# Patient Record
Sex: Female | Born: 1935 | Race: White | Hispanic: No | Smoking: Never smoker
Health system: Southern US, Community
[De-identification: ages and names within clinical notes are randomized; demographics above are authoritative.]

## PROBLEM LIST (undated history)

## (undated) DIAGNOSIS — Z1509 Genetic susceptibility to other malignant neoplasm: Secondary | ICD-10-CM

## (undated) DIAGNOSIS — E7800 Pure hypercholesterolemia, unspecified: Secondary | ICD-10-CM

## (undated) DIAGNOSIS — R0989 Other specified symptoms and signs involving the circulatory and respiratory systems: Secondary | ICD-10-CM

## (undated) DIAGNOSIS — M1990 Unspecified osteoarthritis, unspecified site: Secondary | ICD-10-CM

## (undated) DIAGNOSIS — Z1501 Genetic susceptibility to malignant neoplasm of breast: Secondary | ICD-10-CM

## (undated) DIAGNOSIS — K5790 Diverticulosis of intestine, part unspecified, without perforation or abscess without bleeding: Secondary | ICD-10-CM

## (undated) DIAGNOSIS — I10 Essential (primary) hypertension: Secondary | ICD-10-CM

## (undated) DIAGNOSIS — T7840XA Allergy, unspecified, initial encounter: Secondary | ICD-10-CM

## (undated) DIAGNOSIS — J383 Other diseases of vocal cords: Secondary | ICD-10-CM

## (undated) DIAGNOSIS — Z1379 Encounter for other screening for genetic and chromosomal anomalies: Principal | ICD-10-CM

## (undated) DIAGNOSIS — E042 Nontoxic multinodular goiter: Secondary | ICD-10-CM

## (undated) DIAGNOSIS — R490 Dysphonia: Secondary | ICD-10-CM

## (undated) DIAGNOSIS — C4490 Unspecified malignant neoplasm of skin, unspecified: Secondary | ICD-10-CM

## (undated) DIAGNOSIS — E559 Vitamin D deficiency, unspecified: Secondary | ICD-10-CM

## (undated) DIAGNOSIS — Z8481 Family history of carrier of genetic disease: Secondary | ICD-10-CM

## (undated) DIAGNOSIS — E538 Deficiency of other specified B group vitamins: Secondary | ICD-10-CM

## (undated) HISTORY — PX: OTHER SURGICAL HISTORY: SHX169

## (undated) HISTORY — DX: Unspecified osteoarthritis, unspecified site: M19.90

## (undated) HISTORY — DX: Family history of carrier of genetic disease: Z84.81

## (undated) HISTORY — PX: HERNIA REPAIR: SHX51

## (undated) HISTORY — PX: GALLBLADDER SURGERY: SHX652

## (undated) HISTORY — PX: BIOPSY THYROID: PRO38

## (undated) HISTORY — DX: Encounter for other screening for genetic and chromosomal anomalies: Z13.79

## (undated) HISTORY — DX: Allergy, unspecified, initial encounter: T78.40XA

## (undated) HISTORY — PX: ABDOMINAL HYSTERECTOMY: SHX81

## (undated) HISTORY — DX: Pure hypercholesterolemia, unspecified: E78.00

## (undated) HISTORY — PX: LIPOMA EXCISION: SHX5283

## (undated) HISTORY — DX: Genetic susceptibility to malignant neoplasm of breast: Z15.01

## (undated) HISTORY — PX: CHOLECYSTECTOMY: SHX55

## (undated) HISTORY — DX: Genetic susceptibility to other malignant neoplasm: Z15.09

## (undated) HISTORY — DX: Other specified symptoms and signs involving the circulatory and respiratory systems: R09.89

## (undated) HISTORY — DX: Dysphonia: R49.0

## (undated) HISTORY — PX: OOPHORECTOMY: SHX86

## (undated) HISTORY — PX: COLONOSCOPY: SHX174

## (undated) HISTORY — DX: Deficiency of other specified B group vitamins: E53.8

## (undated) HISTORY — DX: Other diseases of vocal cords: J38.3

## (undated) HISTORY — DX: Diverticulosis of intestine, part unspecified, without perforation or abscess without bleeding: K57.90

## (undated) HISTORY — DX: Vitamin D deficiency, unspecified: E55.9

## (undated) HISTORY — DX: Essential (primary) hypertension: I10

---

## 2000-01-07 ENCOUNTER — Other Ambulatory Visit: Admission: RE | Admit: 2000-01-07 | Discharge: 2000-01-07 | Payer: Self-pay | Admitting: *Deleted

## 2004-03-01 ENCOUNTER — Ambulatory Visit (HOSPITAL_BASED_OUTPATIENT_CLINIC_OR_DEPARTMENT_OTHER): Admission: RE | Admit: 2004-03-01 | Discharge: 2004-03-01 | Payer: Self-pay | Admitting: Surgery

## 2004-03-01 ENCOUNTER — Ambulatory Visit (HOSPITAL_COMMUNITY): Admission: RE | Admit: 2004-03-01 | Discharge: 2004-03-01 | Payer: Self-pay | Admitting: Surgery

## 2011-03-31 DIAGNOSIS — Z1331 Encounter for screening for depression: Secondary | ICD-10-CM | POA: Diagnosis not present

## 2011-03-31 DIAGNOSIS — R1900 Intra-abdominal and pelvic swelling, mass and lump, unspecified site: Secondary | ICD-10-CM | POA: Diagnosis not present

## 2011-04-03 ENCOUNTER — Other Ambulatory Visit: Payer: Self-pay | Admitting: Family Medicine

## 2011-04-03 ENCOUNTER — Ambulatory Visit
Admission: RE | Admit: 2011-04-03 | Discharge: 2011-04-03 | Disposition: A | Discharge status: Home / Self Care | Payer: Medicare Other | Source: Ambulatory Visit | Attending: Family Medicine | Admitting: Family Medicine

## 2011-04-03 DIAGNOSIS — R1031 Right lower quadrant pain: Secondary | ICD-10-CM

## 2011-04-03 DIAGNOSIS — R109 Unspecified abdominal pain: Secondary | ICD-10-CM

## 2011-04-03 DIAGNOSIS — K4090 Unilateral inguinal hernia, without obstruction or gangrene, not specified as recurrent: Secondary | ICD-10-CM | POA: Diagnosis not present

## 2011-04-03 DIAGNOSIS — R1900 Intra-abdominal and pelvic swelling, mass and lump, unspecified site: Secondary | ICD-10-CM

## 2011-04-03 DIAGNOSIS — R1909 Other intra-abdominal and pelvic swelling, mass and lump: Secondary | ICD-10-CM | POA: Diagnosis not present

## 2011-04-03 MED ORDER — IOHEXOL 300 MG/ML  SOLN
100.0000 mL | Freq: Once | INTRAMUSCULAR | Status: AC | PRN
  Administered 2011-04-03: 100 mL via INTRAVENOUS

## 2011-04-03 MED ORDER — IOHEXOL 300 MG/ML  SOLN
20.0000 mL | Freq: Once | INTRAMUSCULAR | Status: AC | PRN
  Administered 2011-04-03: 20 mL via ORAL

## 2011-04-07 ENCOUNTER — Other Ambulatory Visit (HOSPITAL_COMMUNITY): Payer: Self-pay | Admitting: Family Medicine

## 2011-04-07 DIAGNOSIS — R898 Other abnormal findings in specimens from other organs, systems and tissues: Secondary | ICD-10-CM

## 2011-04-07 DIAGNOSIS — R1031 Right lower quadrant pain: Secondary | ICD-10-CM

## 2011-04-08 DIAGNOSIS — D759 Disease of blood and blood-forming organs, unspecified: Secondary | ICD-10-CM | POA: Diagnosis not present

## 2011-04-10 ENCOUNTER — Encounter (HOSPITAL_COMMUNITY)
Admission: RE | Admit: 2011-04-10 | Discharge: 2011-04-10 | Disposition: A | Discharge status: Home / Self Care | Payer: Medicare Other | Source: Ambulatory Visit | Attending: Family Medicine | Admitting: Family Medicine

## 2011-04-10 ENCOUNTER — Other Ambulatory Visit (HOSPITAL_COMMUNITY): Payer: Self-pay | Admitting: Family Medicine

## 2011-04-10 DIAGNOSIS — R1031 Right lower quadrant pain: Secondary | ICD-10-CM

## 2011-04-10 DIAGNOSIS — M19019 Primary osteoarthritis, unspecified shoulder: Secondary | ICD-10-CM | POA: Diagnosis not present

## 2011-04-10 DIAGNOSIS — E119 Type 2 diabetes mellitus without complications: Secondary | ICD-10-CM | POA: Diagnosis not present

## 2011-04-10 DIAGNOSIS — R898 Other abnormal findings in specimens from other organs, systems and tissues: Secondary | ICD-10-CM

## 2011-04-10 DIAGNOSIS — D759 Disease of blood and blood-forming organs, unspecified: Secondary | ICD-10-CM | POA: Diagnosis not present

## 2011-04-10 MED ORDER — TECHNETIUM TC 99M MEDRONATE IV KIT
25.0000 | PACK | Freq: Once | INTRAVENOUS | Status: AC | PRN
  Administered 2011-04-10: 25 via INTRAVENOUS

## 2011-04-17 ENCOUNTER — Other Ambulatory Visit (HOSPITAL_COMMUNITY): Payer: Medicare Other

## 2011-04-17 ENCOUNTER — Encounter (HOSPITAL_COMMUNITY): Payer: Medicare Other

## 2011-04-30 ENCOUNTER — Encounter (INDEPENDENT_AMBULATORY_CARE_PROVIDER_SITE_OTHER): Payer: Self-pay | Admitting: General Surgery

## 2011-05-01 ENCOUNTER — Ambulatory Visit (INDEPENDENT_AMBULATORY_CARE_PROVIDER_SITE_OTHER): Payer: Medicare Other | Admitting: Surgery

## 2011-05-01 ENCOUNTER — Encounter (INDEPENDENT_AMBULATORY_CARE_PROVIDER_SITE_OTHER): Payer: Self-pay | Admitting: Surgery

## 2011-05-01 VITALS — BP 118/64 | HR 80 | Temp 97.8°F | Resp 16 | Ht 60.5 in | Wt 129.6 lb

## 2011-05-01 DIAGNOSIS — K4090 Unilateral inguinal hernia, without obstruction or gangrene, not specified as recurrent: Secondary | ICD-10-CM | POA: Insufficient documentation

## 2011-05-01 NOTE — Progress Notes (Signed)
Sherry Oconnor comes in today with some soreness in her right lower quadrant and a CT scan which are reviewed which shows a small fat-containing inguinal hernia. At first I wasn't convinced that what I'm feeling was a hernia but I think it is an indirect with fat. It's riding fairly high lateral. It is minimally symptomatic. A thoracotomy would be worth watching. As her scheduled appointment see back in 3 months. If she is totally asymptomatic at that time she can then put off followup with me until such time as it become symptomatic. Since it does contain just lateral think she is in imminent danger.  Impression small fat-containing right inguinal hernia. Plan observation. Return in 3 months

## 2011-06-27 DIAGNOSIS — I10 Essential (primary) hypertension: Secondary | ICD-10-CM | POA: Diagnosis not present

## 2011-06-27 DIAGNOSIS — E785 Hyperlipidemia, unspecified: Secondary | ICD-10-CM | POA: Diagnosis not present

## 2011-06-27 DIAGNOSIS — Z23 Encounter for immunization: Secondary | ICD-10-CM | POA: Diagnosis not present

## 2011-06-27 DIAGNOSIS — E119 Type 2 diabetes mellitus without complications: Secondary | ICD-10-CM | POA: Diagnosis not present

## 2011-07-15 DIAGNOSIS — L821 Other seborrheic keratosis: Secondary | ICD-10-CM | POA: Diagnosis not present

## 2011-07-15 DIAGNOSIS — D235 Other benign neoplasm of skin of trunk: Secondary | ICD-10-CM | POA: Diagnosis not present

## 2011-07-15 DIAGNOSIS — L570 Actinic keratosis: Secondary | ICD-10-CM | POA: Diagnosis not present

## 2011-07-31 ENCOUNTER — Encounter (INDEPENDENT_AMBULATORY_CARE_PROVIDER_SITE_OTHER): Payer: Medicare Other | Admitting: Surgery

## 2011-12-11 DIAGNOSIS — Z1231 Encounter for screening mammogram for malignant neoplasm of breast: Secondary | ICD-10-CM | POA: Diagnosis not present

## 2011-12-26 DIAGNOSIS — J309 Allergic rhinitis, unspecified: Secondary | ICD-10-CM | POA: Diagnosis not present

## 2011-12-26 DIAGNOSIS — I998 Other disorder of circulatory system: Secondary | ICD-10-CM | POA: Diagnosis not present

## 2011-12-26 DIAGNOSIS — I10 Essential (primary) hypertension: Secondary | ICD-10-CM | POA: Diagnosis not present

## 2011-12-26 DIAGNOSIS — E119 Type 2 diabetes mellitus without complications: Secondary | ICD-10-CM | POA: Diagnosis not present

## 2011-12-26 DIAGNOSIS — E785 Hyperlipidemia, unspecified: Secondary | ICD-10-CM | POA: Diagnosis not present

## 2011-12-26 DIAGNOSIS — M25569 Pain in unspecified knee: Secondary | ICD-10-CM | POA: Diagnosis not present

## 2011-12-26 DIAGNOSIS — Z23 Encounter for immunization: Secondary | ICD-10-CM | POA: Diagnosis not present

## 2011-12-30 DIAGNOSIS — E1139 Type 2 diabetes mellitus with other diabetic ophthalmic complication: Secondary | ICD-10-CM | POA: Diagnosis not present

## 2012-07-01 DIAGNOSIS — I10 Essential (primary) hypertension: Secondary | ICD-10-CM | POA: Diagnosis not present

## 2012-07-01 DIAGNOSIS — E119 Type 2 diabetes mellitus without complications: Secondary | ICD-10-CM | POA: Diagnosis not present

## 2012-07-01 DIAGNOSIS — E785 Hyperlipidemia, unspecified: Secondary | ICD-10-CM | POA: Diagnosis not present

## 2012-07-01 DIAGNOSIS — Z1331 Encounter for screening for depression: Secondary | ICD-10-CM | POA: Diagnosis not present

## 2012-07-01 DIAGNOSIS — R439 Unspecified disturbances of smell and taste: Secondary | ICD-10-CM | POA: Diagnosis not present

## 2012-09-14 DIAGNOSIS — J387 Other diseases of larynx: Secondary | ICD-10-CM | POA: Diagnosis not present

## 2012-09-14 DIAGNOSIS — R6889 Other general symptoms and signs: Secondary | ICD-10-CM | POA: Diagnosis not present

## 2012-11-23 DIAGNOSIS — L820 Inflamed seborrheic keratosis: Secondary | ICD-10-CM | POA: Diagnosis not present

## 2012-11-23 DIAGNOSIS — L538 Other specified erythematous conditions: Secondary | ICD-10-CM | POA: Diagnosis not present

## 2012-11-23 DIAGNOSIS — L219 Seborrheic dermatitis, unspecified: Secondary | ICD-10-CM | POA: Diagnosis not present

## 2012-11-23 DIAGNOSIS — L570 Actinic keratosis: Secondary | ICD-10-CM | POA: Diagnosis not present

## 2012-12-17 DIAGNOSIS — L820 Inflamed seborrheic keratosis: Secondary | ICD-10-CM | POA: Diagnosis not present

## 2012-12-17 DIAGNOSIS — L570 Actinic keratosis: Secondary | ICD-10-CM | POA: Diagnosis not present

## 2012-12-31 DIAGNOSIS — Z1231 Encounter for screening mammogram for malignant neoplasm of breast: Secondary | ICD-10-CM | POA: Diagnosis not present

## 2013-01-03 DIAGNOSIS — E785 Hyperlipidemia, unspecified: Secondary | ICD-10-CM | POA: Diagnosis not present

## 2013-01-03 DIAGNOSIS — I10 Essential (primary) hypertension: Secondary | ICD-10-CM | POA: Diagnosis not present

## 2013-01-03 DIAGNOSIS — K219 Gastro-esophageal reflux disease without esophagitis: Secondary | ICD-10-CM | POA: Diagnosis not present

## 2013-01-03 DIAGNOSIS — E538 Deficiency of other specified B group vitamins: Secondary | ICD-10-CM | POA: Diagnosis not present

## 2013-01-03 DIAGNOSIS — E119 Type 2 diabetes mellitus without complications: Secondary | ICD-10-CM | POA: Diagnosis not present

## 2013-01-03 DIAGNOSIS — Z79899 Other long term (current) drug therapy: Secondary | ICD-10-CM | POA: Diagnosis not present

## 2013-01-03 DIAGNOSIS — E559 Vitamin D deficiency, unspecified: Secondary | ICD-10-CM | POA: Diagnosis not present

## 2013-01-03 DIAGNOSIS — Z23 Encounter for immunization: Secondary | ICD-10-CM | POA: Diagnosis not present

## 2013-01-04 DIAGNOSIS — E119 Type 2 diabetes mellitus without complications: Secondary | ICD-10-CM | POA: Diagnosis not present

## 2013-01-18 DIAGNOSIS — L570 Actinic keratosis: Secondary | ICD-10-CM | POA: Diagnosis not present

## 2013-01-18 DIAGNOSIS — L820 Inflamed seborrheic keratosis: Secondary | ICD-10-CM | POA: Diagnosis not present

## 2013-04-06 DIAGNOSIS — E538 Deficiency of other specified B group vitamins: Secondary | ICD-10-CM | POA: Diagnosis not present

## 2013-04-06 DIAGNOSIS — E559 Vitamin D deficiency, unspecified: Secondary | ICD-10-CM | POA: Diagnosis not present

## 2013-07-11 DIAGNOSIS — E538 Deficiency of other specified B group vitamins: Secondary | ICD-10-CM | POA: Diagnosis not present

## 2013-07-11 DIAGNOSIS — E119 Type 2 diabetes mellitus without complications: Secondary | ICD-10-CM | POA: Diagnosis not present

## 2013-07-11 DIAGNOSIS — Z0000 Encounter for general adult medical examination without abnormal findings: Secondary | ICD-10-CM | POA: Diagnosis not present

## 2013-07-11 DIAGNOSIS — E559 Vitamin D deficiency, unspecified: Secondary | ICD-10-CM | POA: Diagnosis not present

## 2013-07-11 DIAGNOSIS — Z23 Encounter for immunization: Secondary | ICD-10-CM | POA: Diagnosis not present

## 2013-07-11 DIAGNOSIS — I10 Essential (primary) hypertension: Secondary | ICD-10-CM | POA: Diagnosis not present

## 2013-07-11 DIAGNOSIS — E785 Hyperlipidemia, unspecified: Secondary | ICD-10-CM | POA: Diagnosis not present

## 2013-07-11 DIAGNOSIS — Z1211 Encounter for screening for malignant neoplasm of colon: Secondary | ICD-10-CM | POA: Diagnosis not present

## 2013-07-14 ENCOUNTER — Other Ambulatory Visit: Payer: Self-pay | Admitting: Family Medicine

## 2013-07-14 DIAGNOSIS — E041 Nontoxic single thyroid nodule: Secondary | ICD-10-CM

## 2013-07-18 ENCOUNTER — Other Ambulatory Visit: Payer: Self-pay | Admitting: Family Medicine

## 2013-07-18 ENCOUNTER — Ambulatory Visit
Admission: RE | Admit: 2013-07-18 | Discharge: 2013-07-18 | Disposition: A | Discharge status: Home / Self Care | Payer: Medicare Other | Source: Ambulatory Visit | Attending: Family Medicine | Admitting: Family Medicine

## 2013-07-18 DIAGNOSIS — E041 Nontoxic single thyroid nodule: Secondary | ICD-10-CM

## 2013-07-18 DIAGNOSIS — E042 Nontoxic multinodular goiter: Secondary | ICD-10-CM

## 2013-07-27 DIAGNOSIS — R195 Other fecal abnormalities: Secondary | ICD-10-CM | POA: Diagnosis not present

## 2013-07-28 ENCOUNTER — Ambulatory Visit
Admission: RE | Admit: 2013-07-28 | Discharge: 2013-07-28 | Disposition: A | Discharge status: Home / Self Care | Payer: Medicare Other | Source: Ambulatory Visit | Attending: Family Medicine | Admitting: Family Medicine

## 2013-07-28 ENCOUNTER — Other Ambulatory Visit (HOSPITAL_COMMUNITY)
Admission: RE | Admit: 2013-07-28 | Discharge: 2013-07-28 | Disposition: A | Discharge status: Home / Self Care | Payer: Medicare Other | Source: Ambulatory Visit | Attending: Interventional Radiology | Admitting: Interventional Radiology

## 2013-07-28 DIAGNOSIS — E042 Nontoxic multinodular goiter: Secondary | ICD-10-CM | POA: Insufficient documentation

## 2013-07-28 DIAGNOSIS — E041 Nontoxic single thyroid nodule: Secondary | ICD-10-CM | POA: Diagnosis not present

## 2013-07-28 DIAGNOSIS — E0789 Other specified disorders of thyroid: Secondary | ICD-10-CM | POA: Diagnosis not present

## 2013-07-29 ENCOUNTER — Emergency Department (HOSPITAL_COMMUNITY): Payer: Medicare Other

## 2013-07-29 ENCOUNTER — Encounter (HOSPITAL_COMMUNITY): Payer: Self-pay | Admitting: Emergency Medicine

## 2013-07-29 ENCOUNTER — Emergency Department (HOSPITAL_COMMUNITY)
Admission: EM | Admit: 2013-07-29 | Discharge: 2013-07-29 | Disposition: A | Discharge status: Home / Self Care | Payer: Medicare Other | Attending: Emergency Medicine | Admitting: Emergency Medicine

## 2013-07-29 DIAGNOSIS — M129 Arthropathy, unspecified: Secondary | ICD-10-CM | POA: Insufficient documentation

## 2013-07-29 DIAGNOSIS — IMO0002 Reserved for concepts with insufficient information to code with codable children: Secondary | ICD-10-CM | POA: Insufficient documentation

## 2013-07-29 DIAGNOSIS — Z791 Long term (current) use of non-steroidal anti-inflammatories (NSAID): Secondary | ICD-10-CM | POA: Diagnosis not present

## 2013-07-29 DIAGNOSIS — E119 Type 2 diabetes mellitus without complications: Secondary | ICD-10-CM | POA: Diagnosis not present

## 2013-07-29 DIAGNOSIS — E7800 Pure hypercholesterolemia, unspecified: Secondary | ICD-10-CM | POA: Diagnosis not present

## 2013-07-29 DIAGNOSIS — I10 Essential (primary) hypertension: Secondary | ICD-10-CM | POA: Diagnosis not present

## 2013-07-29 DIAGNOSIS — Z7982 Long term (current) use of aspirin: Secondary | ICD-10-CM | POA: Diagnosis not present

## 2013-07-29 DIAGNOSIS — M542 Cervicalgia: Secondary | ICD-10-CM | POA: Insufficient documentation

## 2013-07-29 DIAGNOSIS — E042 Nontoxic multinodular goiter: Secondary | ICD-10-CM | POA: Diagnosis not present

## 2013-07-29 LAB — BASIC METABOLIC PANEL
BUN: 24 mg/dL — ABNORMAL HIGH (ref 6–23)
CALCIUM: 10 mg/dL (ref 8.4–10.5)
CO2: 25 mEq/L (ref 19–32)
Chloride: 106 mEq/L (ref 96–112)
Creatinine, Ser: 0.66 mg/dL (ref 0.50–1.10)
GFR, EST NON AFRICAN AMERICAN: 82 mL/min — AB (ref >90)
Glucose, Bld: 139 mg/dL — ABNORMAL HIGH (ref 70–99)
Potassium: 4.1 mEq/L (ref 3.7–5.3)
SODIUM: 144 meq/L (ref 137–147)

## 2013-07-29 LAB — CBC WITH DIFFERENTIAL/PLATELET
BASOS ABS: 0 10*3/uL (ref 0.0–0.1)
BASOS PCT: 0 % (ref 0–1)
EOS ABS: 0.1 10*3/uL (ref 0.0–0.7)
EOS PCT: 1 % (ref 0–5)
HCT: 40.2 % (ref 36.0–46.0)
Hemoglobin: 13.8 g/dL (ref 12.0–15.0)
LYMPHS PCT: 30 % (ref 12–46)
Lymphs Abs: 2.2 10*3/uL (ref 0.7–4.0)
MCH: 30.9 pg (ref 26.0–34.0)
MCHC: 34.3 g/dL (ref 30.0–36.0)
MCV: 89.9 fL (ref 78.0–100.0)
Monocytes Absolute: 0.5 10*3/uL (ref 0.1–1.0)
Monocytes Relative: 7 % (ref 3–12)
Neutro Abs: 4.5 10*3/uL (ref 1.7–7.7)
Neutrophils Relative %: 62 % (ref 43–77)
PLATELETS: 260 10*3/uL (ref 150–400)
RBC: 4.47 MIL/uL (ref 3.87–5.11)
RDW: 12.6 % (ref 11.5–15.5)
WBC: 7.2 10*3/uL (ref 4.0–10.5)

## 2013-07-29 MED ORDER — IOHEXOL 300 MG/ML  SOLN
75.0000 mL | Freq: Once | INTRAMUSCULAR | Status: AC | PRN
  Administered 2013-07-29: 75 mL via INTRAVENOUS

## 2013-07-29 NOTE — ED Provider Notes (Signed)
CSN: [id number]     Arrival date & time 07/29/13  9735 History   First MD Initiated Contact with Patient 07/29/13 1038     Chief Complaint  Patient presents with  . Neck Pain     (Consider location/radiation/quality/duration/timing/severity/associated sxs/prior Treatment) The history is provided by the patient and medical records.   This is a 80 year old female with past medical history significant for hypertension, diabetes, hypercholesterolemia, arthritis, presenting to the ED for right-sided neck pain for the past 2 days.  Patient states she has a thyroid biopsy yesterday at [facility] for further evaluation of a thyroid nodule.  Patient states procedure was without complication, she has minimal bruising.  States after procedure, she is felt that swelling and pain had intensified-- thought possibly due to the way her head and neck were positioned on table. She reports some pain with swallowing, but no difficulty doing so. She was having some nausea earlier today but denies vomiting. No other prior neck procedures.  No chest pain or SOB.  Denies any headache, cough, sore throat, nasal congestion, fever, chills, or other URI sx.  Pt has been taking mobic without significant relief of her pain.  Past Medical History  Diagnosis Date  . Hypertension   . Diabetes mellitus   . Hypercholesteremia   . Diverticulosis   . Arthritis    Past Surgical History  Procedure Laterality Date  . Abdominal hysterectomy    . Gallbladder surgery    . Lipoma excision    . Oophorectomy     Family History  Problem Relation Age of Onset  . Heart disease Mother   . Cancer Sister     breast   History  Substance Use Topics  . Smoking status: Never Smoker   . Smokeless tobacco: Not on file  . Alcohol Use: No   OB History   Grav Para Term Preterm Abortions TAB SAB Ect Mult Living                 Review of Systems  Musculoskeletal: Positive for neck pain.  All other systems reviewed and are  negative.     Allergies  Darvon; Septra; Sulfa drugs cross reactors; Zithromax; and Codeine  Home Medications   Prior to Admission medications   Medication Sig Start Date End Date Taking? Authorizing Provider  amLODipine (NORVASC) 5 MG tablet Take 5 mg by mouth daily.   Yes Historical Provider, MD  aspirin 325 MG EC tablet Take 325 mg by mouth daily.   Yes Historical Provider, MD  Cholecalciferol (VITAMIN D PO) Take 1 tablet by mouth daily.   Yes Historical Provider, MD  Cyanocobalamin (VITAMIN B 12 PO) Take 1 tablet by mouth daily.   Yes Historical Provider, MD  fluticasone (FLONASE) 50 MCG/ACT nasal spray Place 2 sprays into the nose 2 (two) times daily at 10 AM and 5 PM.   Yes Historical Provider, MD  lisinopril (PRINIVIL,ZESTRIL) 20 MG tablet Take 20 mg by mouth daily.   Yes Historical Provider, MD  meloxicam (MOBIC) 15 MG tablet Take 15 mg by mouth daily as needed for pain.   Yes Historical Provider, MD  metFORMIN (GLUCOPHAGE-XR) 750 MG 24 hr tablet Take 1,500 mg by mouth every evening.    Yes Historical Provider, MD  naproxen sodium (ANAPROX) 220 MG tablet Take 220 mg by mouth as needed (pain).    Yes Historical Provider, MD  pravastatin (PRAVACHOL) 80 MG tablet Take 80 mg by mouth daily.   Yes Historical Provider, MD  BP 166/63  Pulse 75  Temp(Src) 98 F (36.7 C) (Oral)  Resp 17  SpO2 100%  Physical Exam  Nursing note and vitals reviewed. Constitutional: She is oriented to person, place, and time. She appears well-developed and well-nourished. No distress.  HENT:  Head: Normocephalic and atraumatic.  Mouth/Throat: Oropharynx is clear and moist.  Eyes: Conjunctivae and EOM are normal. Pupils are equal, round, and reactive to light.  Neck: Normal range of motion. No spinous process tenderness and no muscular tenderness present. No rigidity. No tracheal deviation present. Thyromegaly present.  Mild right sided neck tenderness and swelling along SCM muscle; full ROM  maintained; no nuchal rigidity; trachea midline, thyroid does feel enlarged; strong carotid pulses bilaterally; no bruits appreciated; handling secretions appropriately; no difficulty swallowing Small right sided neck needle incision clean without signs of infection; small amount of bruising to top of sternum which is non-tender  Cardiovascular: Normal rate, regular rhythm and normal heart sounds.   Pulmonary/Chest: Effort normal and breath sounds normal. No stridor. No respiratory distress. She has no wheezes.  Abdominal: Soft. Bowel sounds are normal.  Musculoskeletal: Normal range of motion.  Neurological: She is alert and oriented to person, place, and time.  Skin: Skin is warm and dry. She is not diaphoretic.  Psychiatric: She has a normal mood and affect.    ED Course  Procedures (including critical care time) Labs Review Labs Reviewed  BASIC METABOLIC PANEL - Abnormal; Notable for the following:    Glucose, Bld 139 (*)    BUN 24 (*)    GFR calc non Af Amer 82 (*)    All other components within normal limits  CBC WITH DIFFERENTIAL    Imaging Review US Thyroid Biopsy  07/28/2013   CLINICAL DATA:  Dominant 3 cm right thyroid mass, multinodular thyroid  EXAM: ULTRASOUND GUIDED NEEDLE ASPIRATE BIOPSY OF THE THYROID GLAND  COMPARISON:  07/18/2013  PROCEDURE: Thyroid biopsy was thoroughly discussed with the patient and questions were answered. The benefits, risks, alternatives, and complications were also discussed. The patient understands and wishes to proceed with the procedure. Written consent was obtained.  Ultrasound was performed to localize and mark an adequate site for the biopsy. The patient was then prepped and draped in a normal sterile fashion. Local anesthesia was provided with 1% lidocaine. Using direct ultrasound guidance, 4 passes were made using needles into the nodule within the right lobe of the thyroid. Ultrasound was used to confirm needle placements on all occasions.  Specimens were sent to Pathology for analysis.  Complications:  No immediate  FINDINGS: Imaging confirms needle placement in the dominant right 3 cm thyroid mass  IMPRESSION: Ultrasound guided needle aspirate biopsy performed of the dominant right 3 cm thyroid mass.  Recommendation: The smaller ill-defined left thyroid nodules should be followed with a repeat thyroid ultrasound in 1 year. This was discussed with the patient at the time of biopsy.   Electronically Signed   By: Daryll Brod M.D.   On: 07/28/2013 09:32     EKG Interpretation None      MDM   Final diagnoses:  Neck pain on right side   80 y.o. F with right sided neck pain for the past 3 days, worsening after thyroid bx yesterday.  Pt currently afebrile and non-toxic appearing.  Pain tracks along her SCM, mild tenderness and swelling associated.  No nuchal rigidity or pain with ROM.  No airway compromise, handling secretions appropriately.  Concern for possible post-op complication, bleeding, or hematoma.  Will obtain basic labs and CT soft tissue of neck.  Labs reassuring.  CT negative for acute findings.  On re-evaluation, pt remains without airway compromise or difficulty handling secretions.  She continues speaking in full complete sentences without difficulty.  No signs of meningitis or other toxicity.  Pts pain is reproducible with palpation to right side her neck, likely MSK.  Pt will be discharged home with close PCP FU.  Discussed plan with patient, he/she acknowledged understanding and agreed with plan of care.  Return precautions given for new or worsening symptoms.  Case discussed with Dr. Jeanell Sparrow who personally evaluated pt and agrees with assessment and plan of care.  Larene Pickett, PA-C 07/29/13 1531

## 2013-07-29 NOTE — ED Notes (Signed)
Pt reports pain to right side of neck x 2 days. Worse with palpation. States at times hurts when she swallows. Denies recent illness. Reports nausea this am. States yesterday had biopsy of thyroid.

## 2013-07-29 NOTE — Discharge Instructions (Signed)
You work-up today including labs and neck CT were normal. You may follow-up with your primary care physician. Return to the ED for new or worsening symptoms-- high fever, difficulty swallowing, trouble breathing, etc.

## 2013-07-29 NOTE — ED Notes (Signed)
Pt coming from home with c/o of discomfort in the right neck.  Pt states she has noticed swelling and soreness for approximately 3 days.  Pt right neck is noticably larger than the left, denies injury.

## 2013-08-01 NOTE — ED Provider Notes (Signed)
80 y.o. Female complaining of neck pain after thyroid biopsy several days ago.  No difficulty speaking or breathing.  Patient had ct done which reveals no evidence of anatomical abnormality from procedure.  No pe signs worrisome for infection.   I performed a history and physical examination of Sherry Oconnor and discussed her management with Ms. Sherry Oconnor  I agree with the history, physical, assessment, and plan of care, with the following exceptions: None  I was present for the following procedures: None Time Spent in Critical Care of the patient: None Time spent in discussions with the patient and family: [place], MD 08/01/13 [phone]

## 2013-08-02 DIAGNOSIS — M542 Cervicalgia: Secondary | ICD-10-CM | POA: Diagnosis not present

## 2013-08-16 DIAGNOSIS — R195 Other fecal abnormalities: Secondary | ICD-10-CM | POA: Diagnosis not present

## 2013-08-25 ENCOUNTER — Other Ambulatory Visit: Payer: Self-pay | Admitting: Gastroenterology

## 2013-08-25 DIAGNOSIS — R195 Other fecal abnormalities: Secondary | ICD-10-CM | POA: Diagnosis not present

## 2013-08-25 DIAGNOSIS — D126 Benign neoplasm of colon, unspecified: Secondary | ICD-10-CM | POA: Diagnosis not present

## 2013-08-25 DIAGNOSIS — K5730 Diverticulosis of large intestine without perforation or abscess without bleeding: Secondary | ICD-10-CM | POA: Diagnosis not present

## 2013-11-18 DIAGNOSIS — Z23 Encounter for immunization: Secondary | ICD-10-CM | POA: Diagnosis not present

## 2014-01-12 DIAGNOSIS — E785 Hyperlipidemia, unspecified: Secondary | ICD-10-CM | POA: Diagnosis not present

## 2014-01-12 DIAGNOSIS — E049 Nontoxic goiter, unspecified: Secondary | ICD-10-CM | POA: Diagnosis not present

## 2014-01-12 DIAGNOSIS — I10 Essential (primary) hypertension: Secondary | ICD-10-CM | POA: Diagnosis not present

## 2014-01-12 DIAGNOSIS — E119 Type 2 diabetes mellitus without complications: Secondary | ICD-10-CM | POA: Diagnosis not present

## 2014-01-12 DIAGNOSIS — E559 Vitamin D deficiency, unspecified: Secondary | ICD-10-CM | POA: Diagnosis not present

## 2014-01-12 DIAGNOSIS — E538 Deficiency of other specified B group vitamins: Secondary | ICD-10-CM | POA: Diagnosis not present

## 2014-01-19 DIAGNOSIS — E119 Type 2 diabetes mellitus without complications: Secondary | ICD-10-CM | POA: Diagnosis not present

## 2014-01-25 DIAGNOSIS — H1013 Acute atopic conjunctivitis, bilateral: Secondary | ICD-10-CM | POA: Diagnosis not present

## 2014-07-13 DIAGNOSIS — E119 Type 2 diabetes mellitus without complications: Secondary | ICD-10-CM | POA: Diagnosis not present

## 2014-07-13 DIAGNOSIS — J069 Acute upper respiratory infection, unspecified: Secondary | ICD-10-CM | POA: Diagnosis not present

## 2014-07-13 DIAGNOSIS — E785 Hyperlipidemia, unspecified: Secondary | ICD-10-CM | POA: Diagnosis not present

## 2014-07-13 DIAGNOSIS — M7061 Trochanteric bursitis, right hip: Secondary | ICD-10-CM | POA: Diagnosis not present

## 2014-07-13 DIAGNOSIS — J309 Allergic rhinitis, unspecified: Secondary | ICD-10-CM | POA: Diagnosis not present

## 2014-07-13 DIAGNOSIS — E559 Vitamin D deficiency, unspecified: Secondary | ICD-10-CM | POA: Diagnosis not present

## 2014-07-13 DIAGNOSIS — I10 Essential (primary) hypertension: Secondary | ICD-10-CM | POA: Diagnosis not present

## 2014-07-13 DIAGNOSIS — E049 Nontoxic goiter, unspecified: Secondary | ICD-10-CM | POA: Diagnosis not present

## 2014-10-25 DIAGNOSIS — Z23 Encounter for immunization: Secondary | ICD-10-CM | POA: Diagnosis not present

## 2014-12-20 DIAGNOSIS — Z803 Family history of malignant neoplasm of breast: Secondary | ICD-10-CM | POA: Diagnosis not present

## 2014-12-20 DIAGNOSIS — Z1231 Encounter for screening mammogram for malignant neoplasm of breast: Secondary | ICD-10-CM | POA: Diagnosis not present

## 2015-01-19 ENCOUNTER — Other Ambulatory Visit: Payer: Self-pay | Admitting: Family Medicine

## 2015-01-19 DIAGNOSIS — Z0000 Encounter for general adult medical examination without abnormal findings: Secondary | ICD-10-CM | POA: Diagnosis not present

## 2015-01-19 DIAGNOSIS — M545 Low back pain: Secondary | ICD-10-CM | POA: Diagnosis not present

## 2015-01-19 DIAGNOSIS — E041 Nontoxic single thyroid nodule: Secondary | ICD-10-CM

## 2015-01-19 DIAGNOSIS — E559 Vitamin D deficiency, unspecified: Secondary | ICD-10-CM | POA: Diagnosis not present

## 2015-01-19 DIAGNOSIS — E785 Hyperlipidemia, unspecified: Secondary | ICD-10-CM | POA: Diagnosis not present

## 2015-01-19 DIAGNOSIS — E119 Type 2 diabetes mellitus without complications: Secondary | ICD-10-CM | POA: Diagnosis not present

## 2015-01-19 DIAGNOSIS — I10 Essential (primary) hypertension: Secondary | ICD-10-CM | POA: Diagnosis not present

## 2015-01-19 DIAGNOSIS — E538 Deficiency of other specified B group vitamins: Secondary | ICD-10-CM | POA: Diagnosis not present

## 2015-01-19 DIAGNOSIS — E049 Nontoxic goiter, unspecified: Secondary | ICD-10-CM | POA: Diagnosis not present

## 2015-01-30 ENCOUNTER — Ambulatory Visit
Admission: RE | Admit: 2015-01-30 | Discharge: 2015-01-30 | Disposition: A | Discharge status: Home / Self Care | Payer: Medicare Other | Source: Ambulatory Visit | Attending: Family Medicine | Admitting: Family Medicine

## 2015-01-30 DIAGNOSIS — E042 Nontoxic multinodular goiter: Secondary | ICD-10-CM | POA: Diagnosis not present

## 2015-01-30 DIAGNOSIS — E041 Nontoxic single thyroid nodule: Secondary | ICD-10-CM

## 2015-02-02 DIAGNOSIS — E119 Type 2 diabetes mellitus without complications: Secondary | ICD-10-CM | POA: Diagnosis not present

## 2015-06-28 IMAGING — CT CT NECK W/ CM
4 of 5 series · 16 of 33 positions shown, 18 images · IV contrast (Omni 300)
Comparison: Ultrasound studies 07/18/2013 and biopsy images [id number]
[id number]

CLINICAL DATA: Right-sided neck pain and swelling. Thyroid biopsy
yesterday.

EXAM:
CT NECK WITH CONTRAST
TECHNIQUE: Multidetector CT imaging of the neck was performed using the
standard protocol following the bolus administration of intravenous
contrast.
CONTRAST:  75mL OMNIPAQUE IOHEXOL 300 MG/ML  SOLN

[Series 3: neck 2.0 i31s 3 · axial · 0.44mm/px · z∈[+916,+1038]mm · 4 of 103 slices shown]
[im 21/103  bone]
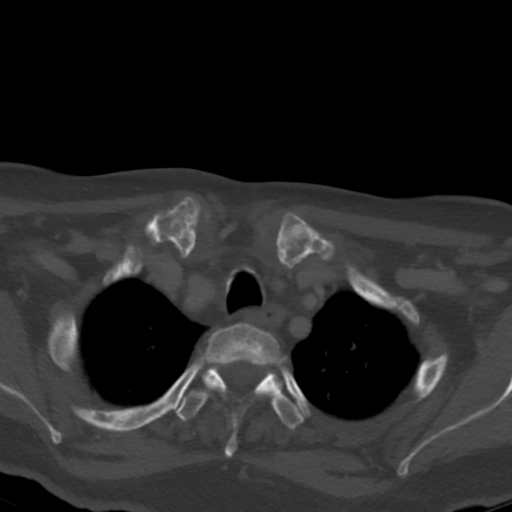
[im 41/103  bone]
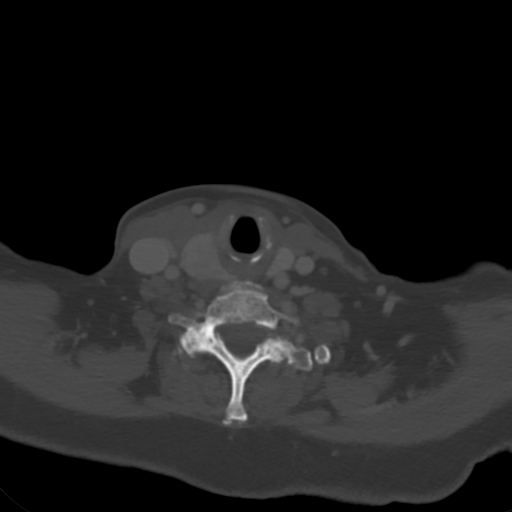
[im 62/103  bone]
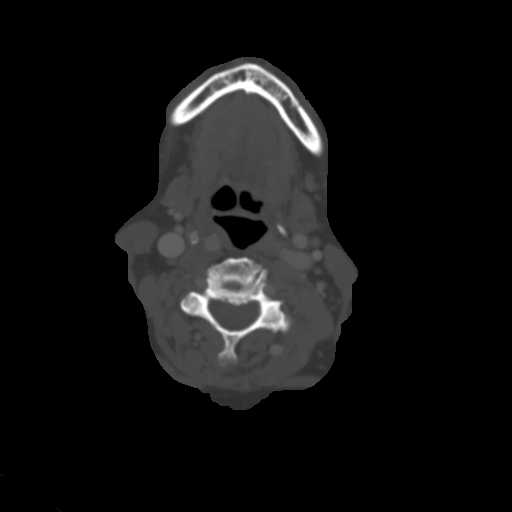
[im 82/103  bone]
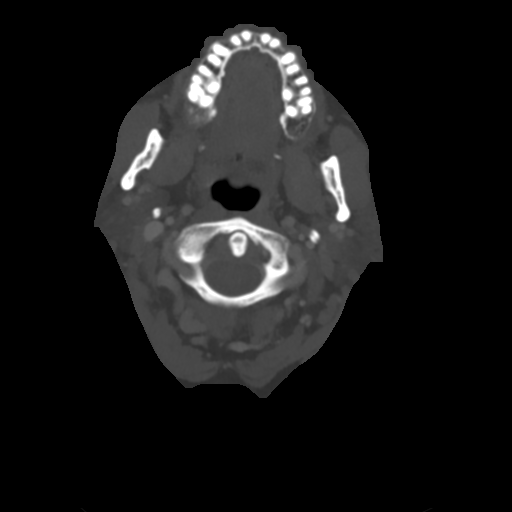

[Series 602: orthog · axial · 0.44mm/px · z∈[+877,+1013]mm · 4 of 118 slices shown, 5 images]
[im 24/118  soft-tissue]
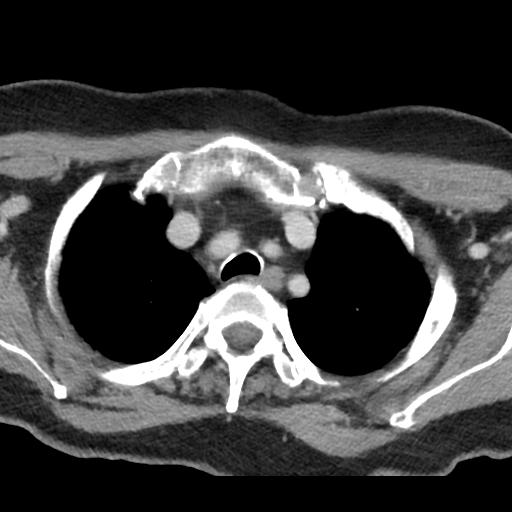
[im 24/118  bone]
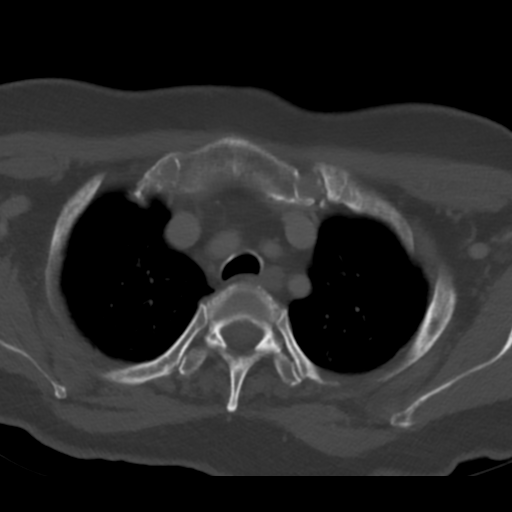
[im 47/118  bone]
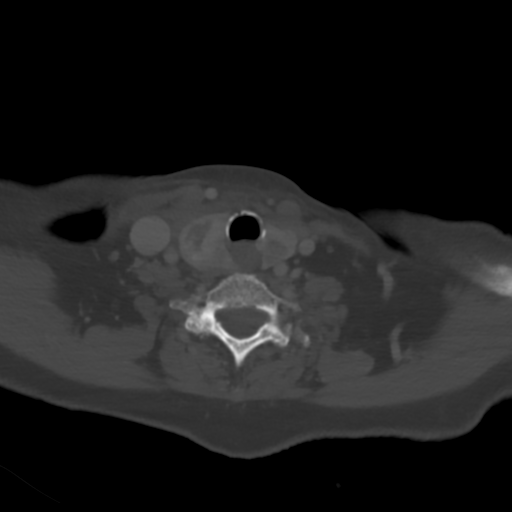
[im 71/118  bone]
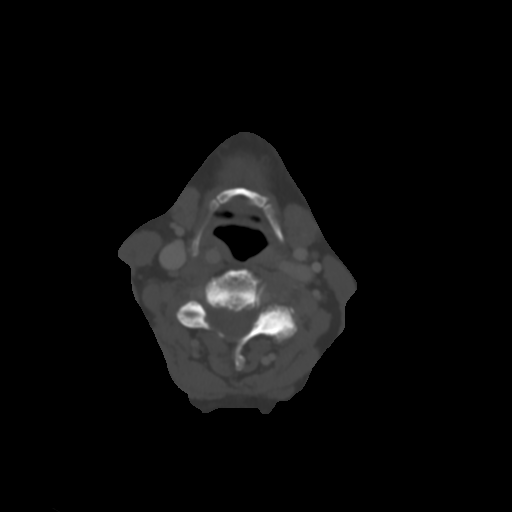
[im 94/118  bone]
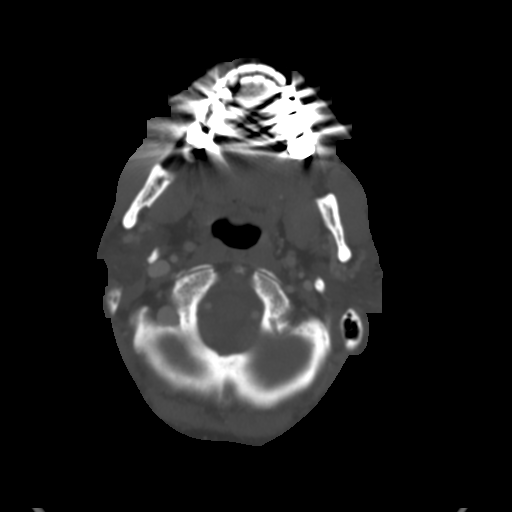

[Series 603: cor · coronal · 0.44mm/px · 3 of 91 slices shown]
[im 25/91  bone]
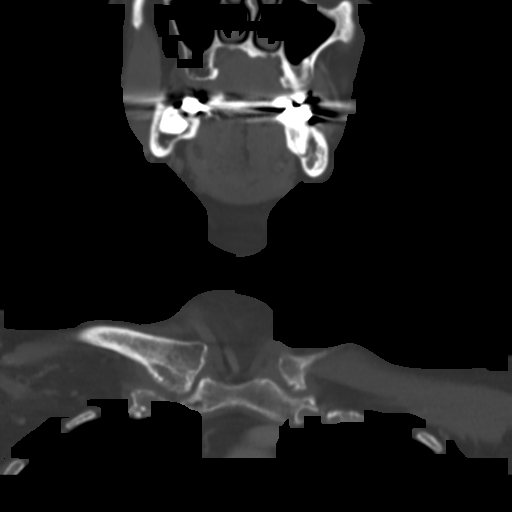
[im 39/91  bone]
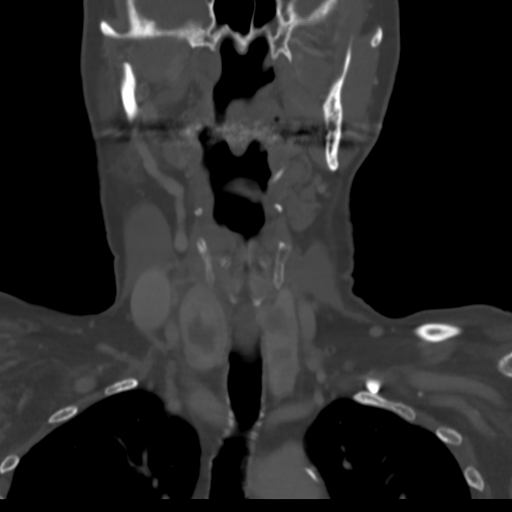
[im 52/91  bone]
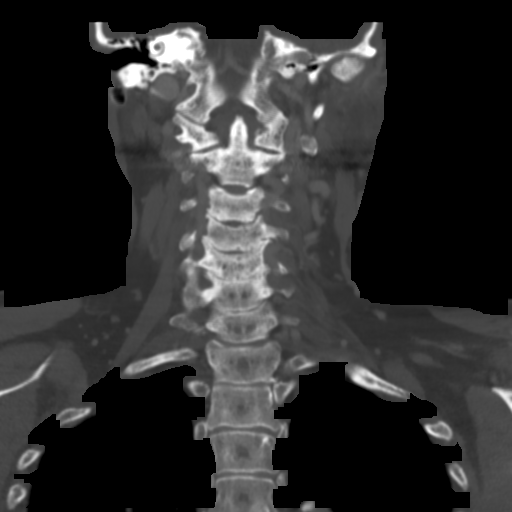

[Series 604: sag · sagittal · 0.44mm/px · 5 of 78 slices shown, 6 images]
[im 26/78  bone]
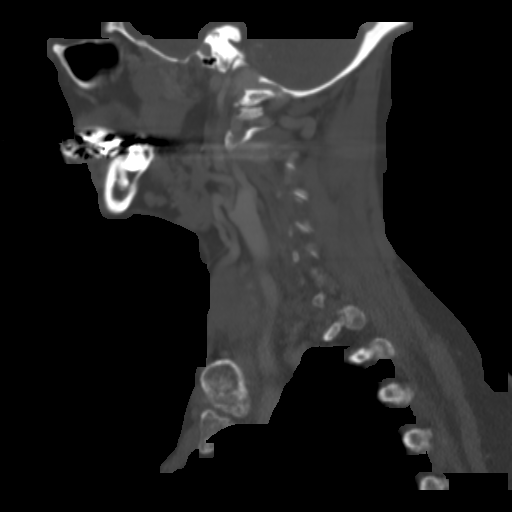
[im 33/78  bone]
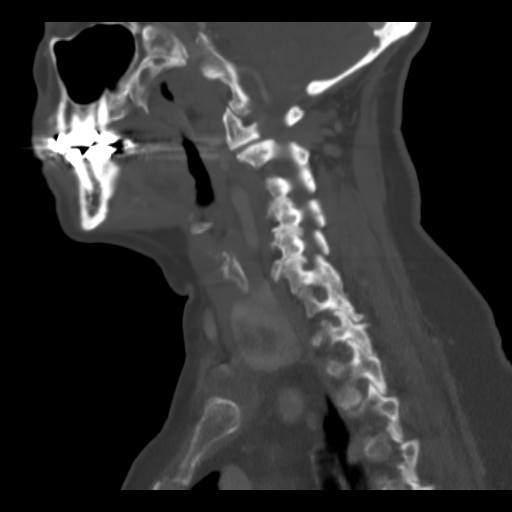
[im 39/78  soft-tissue]
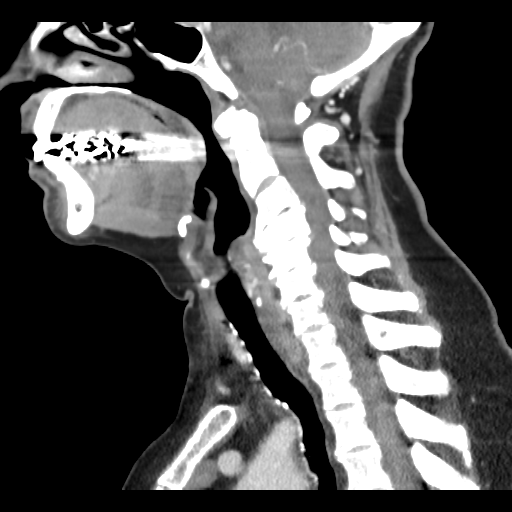
[im 39/78  bone]
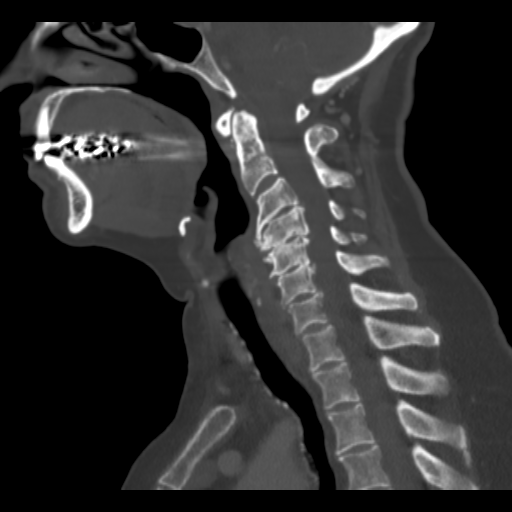
[im 45/78  bone]
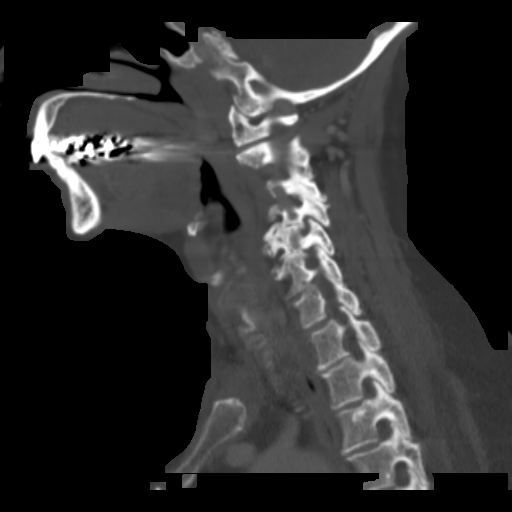
[im 52/78  bone]
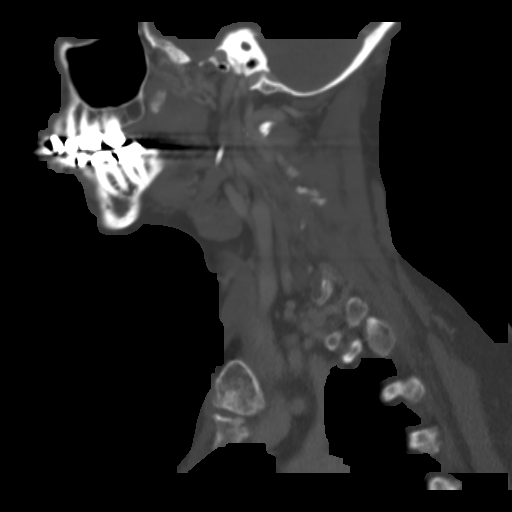

[16 of 33 positions shown; findings below may reference images not displayed]

FINDINGS: Lung apices are clear. No superior mediastinal lesion. Limited
visualization of the intracranial contents does not show any
abnormality.

Both parotid glands are normal. Both submandibular glands are
normal.

There are no enlarged lymph nodes on either side of the neck.
Arterial and venous structures appear patent and unremarkable.

The thyroid gland is enlarged with multiple nodules, more so on the
right than the left. This is consistent with goiter. I do not see
any complication related to the percutaneous biopsy of the dominant
nodule on the right performed yesterday. No sign of hemorrhage.

No mucosal or submucosal lesion is seen.

There is advanced degenerative spondylosis and facet arthropathy of
the cervical spine.
IMPRESSION: No complications seen following percutaneous biopsy of the right
lobe of the thyroid yesterday. Enlarged multinodular gland
consistent with multinodular goiter. No enlarged lymph nodes or
other acute neck pathology identified.

## 2015-07-19 DIAGNOSIS — E538 Deficiency of other specified B group vitamins: Secondary | ICD-10-CM | POA: Diagnosis not present

## 2015-07-19 DIAGNOSIS — E119 Type 2 diabetes mellitus without complications: Secondary | ICD-10-CM | POA: Diagnosis not present

## 2015-07-19 DIAGNOSIS — I10 Essential (primary) hypertension: Secondary | ICD-10-CM | POA: Diagnosis not present

## 2015-07-19 DIAGNOSIS — E785 Hyperlipidemia, unspecified: Secondary | ICD-10-CM | POA: Diagnosis not present

## 2015-08-07 DIAGNOSIS — C44319 Basal cell carcinoma of skin of other parts of face: Secondary | ICD-10-CM | POA: Diagnosis not present

## 2015-08-07 DIAGNOSIS — L570 Actinic keratosis: Secondary | ICD-10-CM | POA: Diagnosis not present

## 2015-08-07 DIAGNOSIS — D225 Melanocytic nevi of trunk: Secondary | ICD-10-CM | POA: Diagnosis not present

## 2015-08-07 DIAGNOSIS — X32XXXD Exposure to sunlight, subsequent encounter: Secondary | ICD-10-CM | POA: Diagnosis not present

## 2015-09-07 DIAGNOSIS — Z85828 Personal history of other malignant neoplasm of skin: Secondary | ICD-10-CM | POA: Diagnosis not present

## 2015-09-07 DIAGNOSIS — X32XXXD Exposure to sunlight, subsequent encounter: Secondary | ICD-10-CM | POA: Diagnosis not present

## 2015-09-07 DIAGNOSIS — Z08 Encounter for follow-up examination after completed treatment for malignant neoplasm: Secondary | ICD-10-CM | POA: Diagnosis not present

## 2015-09-07 DIAGNOSIS — L570 Actinic keratosis: Secondary | ICD-10-CM | POA: Diagnosis not present

## 2015-09-20 DIAGNOSIS — D2311 Other benign neoplasm of skin of right eyelid, including canthus: Secondary | ICD-10-CM | POA: Diagnosis not present

## 2015-10-16 DIAGNOSIS — Z23 Encounter for immunization: Secondary | ICD-10-CM | POA: Diagnosis not present

## 2016-02-04 DIAGNOSIS — E538 Deficiency of other specified B group vitamins: Secondary | ICD-10-CM | POA: Diagnosis not present

## 2016-02-04 DIAGNOSIS — Z0000 Encounter for general adult medical examination without abnormal findings: Secondary | ICD-10-CM | POA: Diagnosis not present

## 2016-02-04 DIAGNOSIS — R21 Rash and other nonspecific skin eruption: Secondary | ICD-10-CM | POA: Diagnosis not present

## 2016-02-04 DIAGNOSIS — I10 Essential (primary) hypertension: Secondary | ICD-10-CM | POA: Diagnosis not present

## 2016-02-04 DIAGNOSIS — E559 Vitamin D deficiency, unspecified: Secondary | ICD-10-CM | POA: Diagnosis not present

## 2016-02-04 DIAGNOSIS — E049 Nontoxic goiter, unspecified: Secondary | ICD-10-CM | POA: Diagnosis not present

## 2016-02-04 DIAGNOSIS — E785 Hyperlipidemia, unspecified: Secondary | ICD-10-CM | POA: Diagnosis not present

## 2016-02-04 DIAGNOSIS — H8110 Benign paroxysmal vertigo, unspecified ear: Secondary | ICD-10-CM | POA: Diagnosis not present

## 2016-02-04 DIAGNOSIS — E119 Type 2 diabetes mellitus without complications: Secondary | ICD-10-CM | POA: Diagnosis not present

## 2016-02-06 DIAGNOSIS — E119 Type 2 diabetes mellitus without complications: Secondary | ICD-10-CM | POA: Diagnosis not present

## 2016-04-04 DIAGNOSIS — Z85828 Personal history of other malignant neoplasm of skin: Secondary | ICD-10-CM | POA: Diagnosis not present

## 2016-04-04 DIAGNOSIS — L821 Other seborrheic keratosis: Secondary | ICD-10-CM | POA: Diagnosis not present

## 2016-04-04 DIAGNOSIS — X32XXXD Exposure to sunlight, subsequent encounter: Secondary | ICD-10-CM | POA: Diagnosis not present

## 2016-04-04 DIAGNOSIS — Z08 Encounter for follow-up examination after completed treatment for malignant neoplasm: Secondary | ICD-10-CM | POA: Diagnosis not present

## 2016-04-04 DIAGNOSIS — L570 Actinic keratosis: Secondary | ICD-10-CM | POA: Diagnosis not present

## 2016-04-04 DIAGNOSIS — L858 Other specified epidermal thickening: Secondary | ICD-10-CM | POA: Diagnosis not present

## 2016-08-04 DIAGNOSIS — Z7984 Long term (current) use of oral hypoglycemic drugs: Secondary | ICD-10-CM | POA: Diagnosis not present

## 2016-08-04 DIAGNOSIS — R42 Dizziness and giddiness: Secondary | ICD-10-CM | POA: Diagnosis not present

## 2016-08-04 DIAGNOSIS — I10 Essential (primary) hypertension: Secondary | ICD-10-CM | POA: Diagnosis not present

## 2016-08-04 DIAGNOSIS — R294 Clicking hip: Secondary | ICD-10-CM | POA: Diagnosis not present

## 2016-08-04 DIAGNOSIS — E785 Hyperlipidemia, unspecified: Secondary | ICD-10-CM | POA: Diagnosis not present

## 2016-08-04 DIAGNOSIS — E119 Type 2 diabetes mellitus without complications: Secondary | ICD-10-CM | POA: Diagnosis not present

## 2016-08-13 DIAGNOSIS — Z9889 Other specified postprocedural states: Secondary | ICD-10-CM | POA: Diagnosis not present

## 2016-08-13 DIAGNOSIS — M25551 Pain in right hip: Secondary | ICD-10-CM | POA: Diagnosis not present

## 2016-08-14 DIAGNOSIS — M25551 Pain in right hip: Secondary | ICD-10-CM | POA: Diagnosis not present

## 2016-08-14 DIAGNOSIS — M7061 Trochanteric bursitis, right hip: Secondary | ICD-10-CM | POA: Diagnosis not present

## 2016-08-28 DIAGNOSIS — M7061 Trochanteric bursitis, right hip: Secondary | ICD-10-CM | POA: Diagnosis not present

## 2016-08-28 DIAGNOSIS — M25551 Pain in right hip: Secondary | ICD-10-CM | POA: Diagnosis not present

## 2016-11-24 DIAGNOSIS — Z23 Encounter for immunization: Secondary | ICD-10-CM | POA: Diagnosis not present

## 2016-11-24 DIAGNOSIS — Z1231 Encounter for screening mammogram for malignant neoplasm of breast: Secondary | ICD-10-CM | POA: Diagnosis not present

## 2017-01-12 DIAGNOSIS — H0011 Chalazion right upper eyelid: Secondary | ICD-10-CM | POA: Diagnosis not present

## 2017-01-19 DIAGNOSIS — H0011 Chalazion right upper eyelid: Secondary | ICD-10-CM | POA: Diagnosis not present

## 2017-02-06 DIAGNOSIS — X32XXXD Exposure to sunlight, subsequent encounter: Secondary | ICD-10-CM | POA: Diagnosis not present

## 2017-02-06 DIAGNOSIS — L570 Actinic keratosis: Secondary | ICD-10-CM | POA: Diagnosis not present

## 2017-02-06 DIAGNOSIS — D0439 Carcinoma in situ of skin of other parts of face: Secondary | ICD-10-CM | POA: Diagnosis not present

## 2017-02-06 DIAGNOSIS — L820 Inflamed seborrheic keratosis: Secondary | ICD-10-CM | POA: Diagnosis not present

## 2017-03-10 DIAGNOSIS — L570 Actinic keratosis: Secondary | ICD-10-CM | POA: Diagnosis not present

## 2017-03-10 DIAGNOSIS — Z08 Encounter for follow-up examination after completed treatment for malignant neoplasm: Secondary | ICD-10-CM | POA: Diagnosis not present

## 2017-03-10 DIAGNOSIS — Z85828 Personal history of other malignant neoplasm of skin: Secondary | ICD-10-CM | POA: Diagnosis not present

## 2017-03-10 DIAGNOSIS — X32XXXD Exposure to sunlight, subsequent encounter: Secondary | ICD-10-CM | POA: Diagnosis not present

## 2017-03-13 DIAGNOSIS — E119 Type 2 diabetes mellitus without complications: Secondary | ICD-10-CM | POA: Diagnosis not present

## 2017-04-02 DIAGNOSIS — E2839 Other primary ovarian failure: Secondary | ICD-10-CM | POA: Diagnosis not present

## 2017-04-02 DIAGNOSIS — E119 Type 2 diabetes mellitus without complications: Secondary | ICD-10-CM | POA: Diagnosis not present

## 2017-04-02 DIAGNOSIS — E538 Deficiency of other specified B group vitamins: Secondary | ICD-10-CM | POA: Diagnosis not present

## 2017-04-02 DIAGNOSIS — I10 Essential (primary) hypertension: Secondary | ICD-10-CM | POA: Diagnosis not present

## 2017-04-02 DIAGNOSIS — R0989 Other specified symptoms and signs involving the circulatory and respiratory systems: Secondary | ICD-10-CM | POA: Diagnosis not present

## 2017-04-02 DIAGNOSIS — Z136 Encounter for screening for cardiovascular disorders: Secondary | ICD-10-CM | POA: Diagnosis not present

## 2017-04-02 DIAGNOSIS — B351 Tinea unguium: Secondary | ICD-10-CM | POA: Diagnosis not present

## 2017-04-02 DIAGNOSIS — E559 Vitamin D deficiency, unspecified: Secondary | ICD-10-CM | POA: Diagnosis not present

## 2017-04-02 DIAGNOSIS — E785 Hyperlipidemia, unspecified: Secondary | ICD-10-CM | POA: Diagnosis not present

## 2017-04-02 DIAGNOSIS — Z0000 Encounter for general adult medical examination without abnormal findings: Secondary | ICD-10-CM | POA: Diagnosis not present

## 2017-04-06 DIAGNOSIS — Z780 Asymptomatic menopausal state: Secondary | ICD-10-CM | POA: Diagnosis not present

## 2017-04-06 DIAGNOSIS — Z8262 Family history of osteoporosis: Secondary | ICD-10-CM | POA: Diagnosis not present

## 2017-04-07 ENCOUNTER — Other Ambulatory Visit: Payer: Self-pay | Admitting: Family Medicine

## 2017-04-07 DIAGNOSIS — R0989 Other specified symptoms and signs involving the circulatory and respiratory systems: Secondary | ICD-10-CM

## 2017-04-14 ENCOUNTER — Ambulatory Visit
Admission: RE | Admit: 2017-04-14 | Discharge: 2017-04-14 | Disposition: A | Discharge status: Home / Self Care | Payer: Medicare Other | Source: Ambulatory Visit | Attending: Family Medicine | Admitting: Family Medicine

## 2017-04-14 DIAGNOSIS — I6523 Occlusion and stenosis of bilateral carotid arteries: Secondary | ICD-10-CM | POA: Diagnosis not present

## 2017-04-14 DIAGNOSIS — R0989 Other specified symptoms and signs involving the circulatory and respiratory systems: Secondary | ICD-10-CM

## 2017-05-05 DIAGNOSIS — Z08 Encounter for follow-up examination after completed treatment for malignant neoplasm: Secondary | ICD-10-CM | POA: Diagnosis not present

## 2017-05-05 DIAGNOSIS — Z85828 Personal history of other malignant neoplasm of skin: Secondary | ICD-10-CM | POA: Diagnosis not present

## 2017-05-05 DIAGNOSIS — L570 Actinic keratosis: Secondary | ICD-10-CM | POA: Diagnosis not present

## 2017-05-05 DIAGNOSIS — X32XXXD Exposure to sunlight, subsequent encounter: Secondary | ICD-10-CM | POA: Diagnosis not present

## 2017-06-16 ENCOUNTER — Encounter: Payer: Self-pay | Admitting: Genetic Counselor

## 2017-06-16 ENCOUNTER — Telehealth: Payer: Self-pay | Admitting: Genetic Counselor

## 2017-06-16 NOTE — Telephone Encounter (Signed)
A genetic counseling appt has been scheduled for the pt to see Steele Berg on 4/25 at 8am. Pt is aware to arrive 15 minutes early. Letter mailed.

## 2017-06-25 ENCOUNTER — Inpatient Hospital Stay: Payer: Medicare Other

## 2017-06-25 ENCOUNTER — Encounter: Payer: Self-pay | Admitting: Genetic Counselor

## 2017-06-25 ENCOUNTER — Other Ambulatory Visit: Payer: Medicare Other

## 2017-06-25 ENCOUNTER — Inpatient Hospital Stay: Payer: Medicare Other | Attending: Genetic Counselor | Admitting: Genetic Counselor

## 2017-06-25 DIAGNOSIS — Z8481 Family history of carrier of genetic disease: Secondary | ICD-10-CM

## 2017-06-25 NOTE — Progress Notes (Signed)
[facility]      Initial Visit   Patient Name: Sherry Oconnor Patient DOB: [birth year] Patient Age: 84 y.o. Encounter Date: 06/25/2017  Referring Provider: Self-referred  Primary Care Provider: Harlan Stains, MD  Reason for Visit: Evaluate for hereditary susceptibility to cancer    Assessment and Plan:  . Ms. Lybarger's history is not suggestive of a hereditary predisposition to cancer or of having the familial BRCA2 mutation. However, she did have a BSO.   Marland Kitchen Testing is recommended to determine whether she has this BRCA2 pathogenic mutation in order for the family to know which side to inform.  . Ms. Harts wished to pursue genetic testing and a blood sample will be sent for analysis of the BRCA2 gene only. She declined expanded panel testing at this time.   Marland Kitchen Results should be available in approximately 2-3 weeks, at which point we will contact her and address implications for her as well as address genetic testing for at-risk family members, if needed.     Dr. Jana Hakim was available for questions concerning this case. Total time spent by me in face-to-face counseling was approximately 25 minutes.   _____________________________________________________________________   History of Present Illness: Ms. Sherry Oconnor, a 84 y.o. female, is being seen at the [facility] due to a family history of cancer and to determine whether she harbors the BRCA2 mutation recently found in her daughter. She presents to clinic today to discuss the possibility of a hereditary predisposition to cancer and discuss whether genetic testing is warranted.  Ms. Poynor has no personal history of cancer. She reported undergoing a yearly mammogram. She does not have gynecologic exams at this time. She has a hysterectomy in her late 56s due to fibroid and then a BSO in her late 14s due to endometriosis.  Her daughter who has breast and pancreatic cancer was  recently found to have a pathogenic mutation in the BRCA2 gene called [id number] ([id number]).  Past Medical History:  Diagnosis Date  . Arthritis   . Diabetes mellitus   . Diverticulosis   . Family history of BRCA2 gene positive    BRCA2 mutation in daughter  . Hypercholesteremia   . Hypertension     Past Surgical History:  Procedure Laterality Date  . ABDOMINAL HYSTERECTOMY    . GALLBLADDER SURGERY    . LIPOMA EXCISION    . OOPHORECTOMY      Social History   Socioeconomic History  . Marital status: Married    Spouse name: Not on file  . Number of children: Not on file  . Years of education: Not on file  . Highest education level: Not on file  Occupational History  . Not on file  Social Needs  . Financial resource strain: Not on file  . Food insecurity:    Worry: Not on file    Inability: Not on file  . Transportation needs:    Medical: Not on file    Non-medical: Not on file  Tobacco Use  . Smoking status: Never Smoker  Substance and Sexual Activity  . Alcohol use: No  . Drug use: No  . Sexual activity: Not on file  Lifestyle  . Physical activity:    Days per week: Not on file    Minutes per session: Not on file  . Stress: Not on file  Relationships  . Social connections:    Talks on phone: Not on file    Gets  together: Not on file    Attends religious service: Not on file    Active member of club or organization: Not on file    Attends meetings of clubs or organizations: Not on file    Relationship status: Not on file  Other Topics Concern  . Not on file  Social History Narrative  . Not on file     Family History:  During the visit, a 4-generation pedigree was obtained. Family tree will be scanned in the Media tab in Epic  Significant diagnoses include the following:  Family History  Problem Relation Age of Onset  . Heart disease Mother   . Breast cancer Daughter 13       Negative BRCA1/BRCA2  . Breast cancer Daughter 71        pancreatic ca at 20; BRCA2 mutation    Additionally, Ms. Pellicano has a son (age (16) and two daughters (noted above). She has no full siblings. She has a maternal half-brother (age 3) who has two daughters in their 85s. Her mother died at 56, cancer-free. Her mother had a brother. Her father died in his 34s of pneumonia. He had 4 brothers and 3 sisters. There are no cancers reported in any of hher grandparents.  Ms. Peachey ancestry is Caucasian - NOS. There is no known Jewish ancestry and no consanguinity.  Discussion: We reviewed the characteristics, features and inheritance patterns of hereditary cancer syndromes, with a focus on the BRCA2 gene. We discussed her risk of harboring a mutation in the context of her personal and family history. We discussed the process of genetic testing, insurance coverage and implications of results: positive, negative and variant of unknown significance (VUS). We addressed that the familial BRCA2 mutation would be expected to be found either in her or her husband who is also getting tested today. If it is not, that may be explained by either non-paternity or more rarely a de novo mutation in their daughter.   Ms. Pint questions were answered to her satisfaction today and she is welcome to call with any additional questions or concerns. Thank you for the referral and allowing [place] to share in the care of your patient.    Steele Berg, MS, [place] Certified Genetic Counselor phone: [phone] [email or web address]

## 2017-07-16 ENCOUNTER — Encounter: Payer: Self-pay | Admitting: Genetic Counselor

## 2017-07-16 DIAGNOSIS — Z1379 Encounter for other screening for genetic and chromosomal anomalies: Secondary | ICD-10-CM | POA: Insufficient documentation

## 2017-07-16 DIAGNOSIS — Z1509 Genetic susceptibility to other malignant neoplasm: Secondary | ICD-10-CM

## 2017-07-16 DIAGNOSIS — Z1501 Genetic susceptibility to malignant neoplasm of breast: Secondary | ICD-10-CM | POA: Insufficient documentation

## 2017-07-16 NOTE — Progress Notes (Signed)
[facility]         Results Disclosure   Patient Name: Sherry Oconnor Patient DOB: 1931-10-14 Patient Age: 84 y.o. Encounter Date: 07/17/2017  Referring Provider: Self-referred  Reason for Call: Discuss results of genetic testing   Ms. Doucet was seen in the [facility] on 06/25/2017 due to a family history of cancer and a pathogenic BRCA2 mutation found in her daughter. Please refer to the prior Genetics clinic note for more information regarding Ms. Soulier's medical and family histories and our assessment at the time.   Genetic Testing: At the time of Ms. Esker's visit, she chose to pursue genetic testing of the BRCA2 gene due to a mutation found in her daughter. Testing included sequencing and deletion/duplication analysis of the BRCA2 gene only. 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  A copy of the genetic test report will be scanned into Epic under the Media tab.  BRCA2-Related Cancer Risks: Women with a BRCA2 mutation have a 40-85% lifetime risk of developing breast cancer, compared to the general population's risk of 12%. The risk of contralateral breast cancer after 5 years of initial diagnosis is about 10%. They also have an increased risk of developing ovarian cancer (20-44%), which is significantly higher than the general population's risk (1-2%). Risks for other types of cancer, including prostate cancer, pancreatic cancer, female breast cancer and melanoma, are increased as well.   Medical Management: Given her age, it is not necessarily indicated for Ms. Perkins to undergo any special screenings or risk reduction procedures. She had a BSO in her 28s. She is urged to discuss the information below with her provider. The following recommendations from the NCCN (Genetic/Familial High-Risk Assessment: Breast and Ovarian V3.2019) are specific to women with a BRCA2 mutation. Because the NCCN updates  these guidelines periodically, clinicians are recommended to view the most recent guidelines at https://www.martin.info/.   Breast Cancer:  - Starting at age 79: Breast awareness - Women should be familiar with their breasts and promptly report changes to their healthcare provider. Performing regular breast self exams may help increase breast awareness, especially when checked at the end of the menstrual cycle.  - Starting at age 76: Clinical breast exam every 6-12 months. - Between ages 23-29: Breast MRI screening with contrast (preferred) every year or mammogram with consideration of tomosynthesis if MRI is unavailable; or individualized based on family history if a breast cancer diagnosis before age 32 is present. - Between ages 41-75: Mammogram with consideration of tomosynthesis and breast MRI screening with contrast every year.  - Option of risk-reducing bilateral mastectomies. - Consider risk reduction agents as options for breast cancer, including discussing risks and benefits. - After age 41: Management should be considered on an individualized basis.  Ovarian Cancer: - Recommend risk-reducing salpingo-oophorectomy (RRSO), typically between 34 and 45y, and upon completion of child bearing, unless age at diagnosis in the family warrants earlier age for consideration of this surgery.   - While there may be circumstances where ovarian cancer screening with transvaginal ultrasound and a blood test for a protein called CA-125 are helpful, these techniques have not been shown to be effective in detecting early ovarian cancer and are generally not recommended. - Consider risk reduction agents as options for ovarian cancer, including discussing risks and benefits.  Other Cancers: Studies have shown other cancers, such as pancreatic cancer and melanoma, to be associated with BRCA2 mutations, but national guidelines do not currently  recommend any specific screenings for these cancers. Screening may be  individualized based on the family history.  For men who have a BRCA2 mutation, the general risk for cancer seems to be only slightly increased. The following are recommendations from the NCCN that are specific to men with a BRCA2 mutation:   Breast Cancer - Breast self-exam training and education starting at age 65 y  - Clinical breast exam, every 12 mo, starting at age 39 y  Prostate Cancer - Starting at age 88 y: Recommend prostate cancer screening for BRCA2 carriers  Family Members: It is important that all of Ms. Dubberly's relatives (both men and women) know of the presence of this gene mutation. Genetic testing can sort out who in the family is at risk and who is not.   Ms. Tocco children each has a 50% chance to have inherited this mutation. She has no full siblings, but is recommended to alert her maternal half-brother. We recommend they see a genetic counselor and have genetic testing, as identifying the presence of this mutation would allow them to also take advantage of risk-reducing measures. Ms. Cotta was provided a copy of her results to send to her relatives.  If a child inherits two BRCA2 mutations, one from each parent, they would have a rare genetic condition called Fanconi Anemia. For this reason, if anyone in the family who has this BRCA2 mutation has young children or is considering having children, they are recommended to have their partner tested.   Support and Resources: If Ms. Shiffman is interested in BRCA-specific information and support, there are two groups, Facing Our Risk (www.facingourrisk.com) and Bright Pink (www.brightpink.org) which some people have found useful. They provide opportunities to speak with other individuals from high-risk families. To locate genetic counselors in other cities, individuals can visit the website of the [employer] (ArtistMovie.se) and Secretary/administrator for a Social worker by zip code.  We encouraged Ms. Bole to remain in  contact with [place] on an annual basis so we can update her personal and family histories, and let her know of advances in cancer genetics that may benefit the family. Our contact number was provided and she knows she is welcome to call anytime with additional questions.    Steele Berg, MS, [place] Certified Genetic Counselor phone: [phone]

## 2017-07-17 ENCOUNTER — Ambulatory Visit: Payer: Self-pay | Admitting: Genetic Counselor

## 2017-07-17 DIAGNOSIS — Z1509 Genetic susceptibility to other malignant neoplasm: Secondary | ICD-10-CM

## 2017-07-17 DIAGNOSIS — Z1379 Encounter for other screening for genetic and chromosomal anomalies: Secondary | ICD-10-CM

## 2017-07-17 DIAGNOSIS — Z1501 Genetic susceptibility to malignant neoplasm of breast: Secondary | ICD-10-CM

## 2017-07-17 HISTORY — DX: Encounter for other screening for genetic and chromosomal anomalies: Z13.79

## 2017-07-17 HISTORY — DX: Genetic susceptibility to malignant neoplasm of breast: Z15.01

## 2017-09-08 DIAGNOSIS — S93401A Sprain of unspecified ligament of right ankle, initial encounter: Secondary | ICD-10-CM | POA: Diagnosis not present

## 2017-09-08 DIAGNOSIS — M79671 Pain in right foot: Secondary | ICD-10-CM | POA: Diagnosis not present

## 2017-09-22 DIAGNOSIS — M79671 Pain in right foot: Secondary | ICD-10-CM | POA: Diagnosis not present

## 2017-10-05 DIAGNOSIS — E559 Vitamin D deficiency, unspecified: Secondary | ICD-10-CM | POA: Diagnosis not present

## 2017-10-05 DIAGNOSIS — E1169 Type 2 diabetes mellitus with other specified complication: Secondary | ICD-10-CM | POA: Diagnosis not present

## 2017-10-05 DIAGNOSIS — Z7984 Long term (current) use of oral hypoglycemic drugs: Secondary | ICD-10-CM | POA: Diagnosis not present

## 2017-10-05 DIAGNOSIS — E785 Hyperlipidemia, unspecified: Secondary | ICD-10-CM | POA: Diagnosis not present

## 2017-10-05 DIAGNOSIS — I10 Essential (primary) hypertension: Secondary | ICD-10-CM | POA: Diagnosis not present

## 2017-10-05 DIAGNOSIS — L989 Disorder of the skin and subcutaneous tissue, unspecified: Secondary | ICD-10-CM | POA: Diagnosis not present

## 2017-10-08 DIAGNOSIS — M79671 Pain in right foot: Secondary | ICD-10-CM | POA: Diagnosis not present

## 2017-11-10 DIAGNOSIS — M79671 Pain in right foot: Secondary | ICD-10-CM | POA: Diagnosis not present

## 2017-11-14 DIAGNOSIS — Z23 Encounter for immunization: Secondary | ICD-10-CM | POA: Diagnosis not present

## 2017-11-26 DIAGNOSIS — Z1231 Encounter for screening mammogram for malignant neoplasm of breast: Secondary | ICD-10-CM | POA: Diagnosis not present

## 2017-11-26 DIAGNOSIS — Z803 Family history of malignant neoplasm of breast: Secondary | ICD-10-CM | POA: Diagnosis not present

## 2017-12-08 DIAGNOSIS — M79671 Pain in right foot: Secondary | ICD-10-CM | POA: Diagnosis not present

## 2018-02-08 ENCOUNTER — Other Ambulatory Visit: Payer: Self-pay | Admitting: Family Medicine

## 2018-02-08 DIAGNOSIS — E049 Nontoxic goiter, unspecified: Secondary | ICD-10-CM

## 2018-02-19 ENCOUNTER — Ambulatory Visit
Admission: RE | Admit: 2018-02-19 | Discharge: 2018-02-19 | Disposition: A | Discharge status: Home / Self Care | Payer: Medicare Other | Source: Ambulatory Visit | Attending: Family Medicine | Admitting: Family Medicine

## 2018-02-19 DIAGNOSIS — E049 Nontoxic goiter, unspecified: Secondary | ICD-10-CM

## 2018-02-19 DIAGNOSIS — E042 Nontoxic multinodular goiter: Secondary | ICD-10-CM | POA: Diagnosis not present

## 2018-03-10 ENCOUNTER — Other Ambulatory Visit: Payer: Self-pay | Admitting: Family Medicine

## 2018-03-10 DIAGNOSIS — E041 Nontoxic single thyroid nodule: Secondary | ICD-10-CM

## 2018-03-16 DIAGNOSIS — H524 Presbyopia: Secondary | ICD-10-CM | POA: Diagnosis not present

## 2018-03-16 DIAGNOSIS — H5203 Hypermetropia, bilateral: Secondary | ICD-10-CM | POA: Diagnosis not present

## 2018-03-16 DIAGNOSIS — E119 Type 2 diabetes mellitus without complications: Secondary | ICD-10-CM | POA: Diagnosis not present

## 2018-03-18 ENCOUNTER — Other Ambulatory Visit (HOSPITAL_COMMUNITY)
Admission: RE | Admit: 2018-03-18 | Discharge: 2018-03-18 | Disposition: A | Discharge status: Home / Self Care | Payer: Medicare Other | Source: Ambulatory Visit | Attending: Radiology | Admitting: Radiology

## 2018-03-18 ENCOUNTER — Ambulatory Visit
Admission: RE | Admit: 2018-03-18 | Discharge: 2018-03-18 | Disposition: A | Discharge status: Home / Self Care | Payer: Medicare Other | Source: Ambulatory Visit | Attending: Family Medicine | Admitting: Family Medicine

## 2018-03-18 DIAGNOSIS — E041 Nontoxic single thyroid nodule: Secondary | ICD-10-CM

## 2018-03-18 DIAGNOSIS — D440 Neoplasm of uncertain behavior of thyroid gland: Secondary | ICD-10-CM | POA: Insufficient documentation

## 2018-04-26 DIAGNOSIS — E042 Nontoxic multinodular goiter: Secondary | ICD-10-CM | POA: Diagnosis not present

## 2018-04-26 DIAGNOSIS — D440 Neoplasm of uncertain behavior of thyroid gland: Secondary | ICD-10-CM | POA: Diagnosis not present

## 2018-04-27 ENCOUNTER — Other Ambulatory Visit: Payer: Self-pay | Admitting: Surgery

## 2018-04-27 DIAGNOSIS — E041 Nontoxic single thyroid nodule: Secondary | ICD-10-CM

## 2018-04-29 ENCOUNTER — Ambulatory Visit
Admission: RE | Admit: 2018-04-29 | Discharge: 2018-04-29 | Disposition: A | Discharge status: Home / Self Care | Payer: Medicare Other | Source: Ambulatory Visit | Attending: Surgery | Admitting: Surgery

## 2018-04-29 DIAGNOSIS — E041 Nontoxic single thyroid nodule: Secondary | ICD-10-CM

## 2018-05-20 ENCOUNTER — Encounter (HOSPITAL_COMMUNITY): Payer: Self-pay | Admitting: Surgery

## 2018-06-01 DIAGNOSIS — E1169 Type 2 diabetes mellitus with other specified complication: Secondary | ICD-10-CM | POA: Diagnosis not present

## 2018-06-01 DIAGNOSIS — Z0000 Encounter for general adult medical examination without abnormal findings: Secondary | ICD-10-CM | POA: Diagnosis not present

## 2018-06-01 DIAGNOSIS — I6521 Occlusion and stenosis of right carotid artery: Secondary | ICD-10-CM | POA: Diagnosis not present

## 2018-06-01 DIAGNOSIS — E785 Hyperlipidemia, unspecified: Secondary | ICD-10-CM | POA: Diagnosis not present

## 2018-06-01 DIAGNOSIS — E041 Nontoxic single thyroid nodule: Secondary | ICD-10-CM | POA: Diagnosis not present

## 2018-06-01 DIAGNOSIS — E559 Vitamin D deficiency, unspecified: Secondary | ICD-10-CM | POA: Diagnosis not present

## 2018-06-01 DIAGNOSIS — E538 Deficiency of other specified B group vitamins: Secondary | ICD-10-CM | POA: Diagnosis not present

## 2018-06-01 DIAGNOSIS — I10 Essential (primary) hypertension: Secondary | ICD-10-CM | POA: Diagnosis not present

## 2018-06-16 DIAGNOSIS — E785 Hyperlipidemia, unspecified: Secondary | ICD-10-CM | POA: Diagnosis not present

## 2018-06-16 DIAGNOSIS — E1169 Type 2 diabetes mellitus with other specified complication: Secondary | ICD-10-CM | POA: Diagnosis not present

## 2018-06-16 DIAGNOSIS — E538 Deficiency of other specified B group vitamins: Secondary | ICD-10-CM | POA: Diagnosis not present

## 2018-06-16 DIAGNOSIS — E559 Vitamin D deficiency, unspecified: Secondary | ICD-10-CM | POA: Diagnosis not present

## 2018-11-02 DIAGNOSIS — E1169 Type 2 diabetes mellitus with other specified complication: Secondary | ICD-10-CM | POA: Diagnosis not present

## 2018-11-02 DIAGNOSIS — K5900 Constipation, unspecified: Secondary | ICD-10-CM | POA: Diagnosis not present

## 2018-11-02 DIAGNOSIS — E785 Hyperlipidemia, unspecified: Secondary | ICD-10-CM | POA: Diagnosis not present

## 2018-11-02 DIAGNOSIS — I10 Essential (primary) hypertension: Secondary | ICD-10-CM | POA: Diagnosis not present

## 2018-11-02 DIAGNOSIS — R1310 Dysphagia, unspecified: Secondary | ICD-10-CM | POA: Diagnosis not present

## 2018-11-04 DIAGNOSIS — I10 Essential (primary) hypertension: Secondary | ICD-10-CM | POA: Diagnosis not present

## 2018-11-04 DIAGNOSIS — E785 Hyperlipidemia, unspecified: Secondary | ICD-10-CM | POA: Diagnosis not present

## 2018-11-04 DIAGNOSIS — E1169 Type 2 diabetes mellitus with other specified complication: Secondary | ICD-10-CM | POA: Diagnosis not present

## 2018-11-23 DIAGNOSIS — Z23 Encounter for immunization: Secondary | ICD-10-CM | POA: Diagnosis not present

## 2018-11-26 DIAGNOSIS — K439 Ventral hernia without obstruction or gangrene: Secondary | ICD-10-CM | POA: Diagnosis not present

## 2019-01-16 NOTE — H&P (Signed)
[name] Documented: 11/26/2018 4:23 PM Location: [facility] Patient #: [id number] DOB: [birth year] Married / Language: Cleophus Molt / Race: White Female   History of Present Illness Rodman Key B. Hassell Done MD; 11/26/2018 5:14 PM) The patient is a 85 year old female who presents for an evaluation of a hernia. Ms. Sherry Oconnor mentioned that I had seen her before about an abdominal wall hernia. On exam she appears to have a right Spilgelian hernia. I explained lap assisted ventral hernia repair. She understands and accepts indications and risk. She has intermittent pain and that is her main reason for repair.    Allergies Sallyanne Kuster, [place]; 11/26/2018 4:24 PM) Codeine Phosphate *ANALGESICS - OPIOID*  Sulfa Antibiotics  Allergies Reconciled   Medication History Sallyanne Kuster, CMA; 11/26/2018 4:24 PM) amLODIPine Besylate (2.5MG  Tablet, Oral) Active. Lisinopril (20MG  Tablet, Oral) Active. metFORMIN HCl ER (750MG  Tablet ER 24HR, Oral) Active. Pravastatin Sodium (80MG  Tablet, Oral) Active. Aspirin (81MG  Tablet, Oral) Active. Medications Reconciled  Vitals Sallyanne Kuster CMA; 11/26/2018 4:25 PM) 11/26/2018 4:24 PM Weight: 119 lb Temp.: 28F  Pulse: 62 (Regular)  P.OX: 98% (Room air) BP: 125/75 (Sitting, Left Arm, Standard)       Physical Exam (Jaimin Krupka B. Hassell Done MD; 11/26/2018 5:16 PM) Abdomen     Assessment & Plan Rodman Key B. Hassell Done MD; 11/26/2018 5:17 PM) SPIGELIAN HERNIA (K43.9) Impression: Plan laparoscopy and open hernia repair. This is on the right side. She has had a prior hysterectomy and there may be adhesions to deal with.

## 2019-01-17 ENCOUNTER — Other Ambulatory Visit (HOSPITAL_COMMUNITY)
Admission: RE | Admit: 2019-01-17 | Discharge: 2019-01-17 | Disposition: A | Discharge status: Home / Self Care | Payer: Medicare Other | Source: Ambulatory Visit | Attending: Surgery | Admitting: Surgery

## 2019-01-17 DIAGNOSIS — Z20828 Contact with and (suspected) exposure to other viral communicable diseases: Secondary | ICD-10-CM | POA: Diagnosis not present

## 2019-01-17 DIAGNOSIS — Z01812 Encounter for preprocedural laboratory examination: Secondary | ICD-10-CM | POA: Diagnosis not present

## 2019-01-17 NOTE — Patient Instructions (Signed)
DUE TO COVID-19 ONLY ONE VISITOR IS ALLOWED TO COME WITH YOU AND STAY IN THE WAITING ROOM ONLY DURING PRE OP AND PROCEDURE DAY OF SURGERY. THE 1 VISITOR MAY VISIT WITH YOU AFTER SURGERY IN YOUR PRIVATE ROOM DURING VISITING HOURS ONLY!  ONCE YOUR COVID TEST IS COMPLETED, PLEASE BEGIN THE QUARANTINE INSTRUCTIONS AS OUTLINED IN YOUR HANDOUT.                Sherry Oconnor    Your procedure is scheduled on: 01/20/19   Report to [facility] Main  Entrance   Report to Short stay  5:30  AM     Call this number if you have problems the morning of surgery [phone]    Remember: Do not eat food or drink liquids :After Midnight  . BRUSH YOUR TEETH MORNING OF SURGERY AND RINSE YOUR MOUTH OUT, NO CHEWING GUM CANDY OR MINTS.     Take these medicines the morning of surgery with A SIP OF WATER: Amlodipine  DO NOT TAKE ANY DIABETIC MEDICATIONS DAY OF YOUR SURGERY      How to Manage Your Diabetes Before and After Surgery  Why is it important to control my blood sugar before and after surgery? . Improving blood sugar levels before and after surgery helps healing and can limit problems. . A way of improving blood sugar control is eating a healthy diet by: o  Eating less sugar and carbohydrates o  Increasing activity/exercise o  Talking with your doctor about reaching your blood sugar goals . High blood sugars (greater than 180 mg/dL) can raise your risk of infections and slow your recovery, so you will need to focus on controlling your diabetes during the weeks before surgery. . Make sure that the doctor who takes care of your diabetes knows about your planned surgery including the date and location.  How do I manage my blood sugar before surgery? . Check your blood sugar at least 4 times a day, starting 2 days before surgery, to make sure that the level is not too high or low. o Check your blood sugar the morning of your surgery when you wake up and every 2 hours until you get to the Short  Stay unit. . If your blood sugar is less than 70 mg/dL, you will need to treat for low blood sugar: o Do not take insulin. o Treat a low blood sugar (less than 70 mg/dL) with  cup of clear juice (cranberry or apple), 4 glucose tablets, OR glucose gel. o Recheck blood sugar in 15 minutes after treatment (to make sure it is greater than 70 mg/dL). If your blood sugar is not greater than 70 mg/dL on recheck, call [phone] for further instructions. . Report your blood sugar to the short stay nurse when you get to Short Stay.  . If you are admitted to the hospital after surgery: o Your blood sugar will be checked by the staff and you will probably be given insulin after surgery (instead of oral diabetes medicines) to make sure you have good blood sugar levels. o The goal for blood sugar control after surgery is 80-180 mg/dL.   WHAT DO I DO ABOUT MY DIABETES MEDICATION?  Marland Kitchen Do not take oral diabetes medicines (pills) the morning of surgery.   .  .   Do not wear nail polish on your fingernails.  Do not shave  48 hours prior to surgery.  No metal on your body. No jewelry, make up, powders, lotions,perfumes or  deodorant.            Do not bring valuables to the hospital. [place].  Contacts, dentures or bridgework may not be worn into surgery.      Patients discharged the day of surgery will not be allowed to drive home.   IF YOU ARE HAVING SURGERY AND GOING HOME THE SAME DAY, YOU MUST HAVE AN ADULT TO DRIVE YOU HOME AND BE WITH YOU FOR 24 HOURS.   YOU MAY GO HOME BY TAXI OR UBER OR ORTHERWISE, BUT AN ADULT MUST ACCOMPANY YOU HOME AND STAY WITH YOU FOR 24 HOURS.  Name and phone number of your driver:  Special Instructions: N/A              Please read over the following fact sheets you were given: _____________________________________________________________________             [facility] - Preparing for Surgery Before surgery,  you can play an important role.   Because skin is not sterile, your skin needs to be as free of germs as possible.   You can reduce the number of germs on your skin by washing with CHG (chlorahexidine gluconate) soap before surgery.   CHG is an antiseptic cleaner which kills germs and bonds with the skin to continue killing germs even after washing. Please DO NOT use if you have an allergy to CHG or antibacterial soaps.   If your skin becomes reddened/irritated stop using the CHG and inform your nurse when you arrive at Short Stay. Do not shave (including legs and underarms) for at least 48 hours prior to the first CHG shower.   . Please follow these instructions carefully:  1.  Shower with CHG Soap the night before surgery and the  morning of Surgery.  2.  If you choose to wash your hair, wash your hair first as usual with your  normal  shampoo.  3.  After you shampoo, rinse your hair and body thoroughly to remove the  shampoo.                                        4.  Use CHG as you would any other liquid soap.  You can apply chg directly  to the skin and wash                       Gently with a scrungie or clean washcloth.  5.  Apply the CHG Soap to your body ONLY FROM THE NECK DOWN.   Do not use on face/ open                           Wound or open sores. Avoid contact with eyes, ears mouth and genitals (private parts).                       Wash face,  Genitals (private parts) with your normal soap.             6.  Wash thoroughly, paying special attention to the area where your surgery  will be performed.  7.  Thoroughly rinse your body with warm water from the neck down.  8.  DO NOT shower/wash  with your normal soap after using and rinsing off  the CHG Soap.             9.  Pat yourself dry with a clean towel.            10.  Wear clean pajamas.            11.  Place clean sheets on your bed the night of your first shower and do not  sleep with pets. Day of Surgery : Do not apply any  lotions/deodorants the morning of surgery.  Please wear clean clothes to the hospital/surgery center.  FAILURE TO FOLLOW THESE INSTRUCTIONS MAY RESULT IN THE CANCELLATION OF YOUR SURGERY PATIENT SIGNATURE_________________________________  NURSE SIGNATURE__________________________________  ________________________________________________________________________

## 2019-01-18 ENCOUNTER — Other Ambulatory Visit: Payer: Self-pay

## 2019-01-18 ENCOUNTER — Encounter (HOSPITAL_COMMUNITY)
Admission: RE | Admit: 2019-01-18 | Discharge: 2019-01-18 | Disposition: A | Discharge status: Home / Self Care | Payer: Medicare Other | Source: Ambulatory Visit | Attending: Surgery | Admitting: Surgery

## 2019-01-18 ENCOUNTER — Encounter (HOSPITAL_COMMUNITY): Payer: Self-pay

## 2019-01-18 DIAGNOSIS — E119 Type 2 diabetes mellitus without complications: Secondary | ICD-10-CM | POA: Insufficient documentation

## 2019-01-18 DIAGNOSIS — R9431 Abnormal electrocardiogram [ECG] [EKG]: Secondary | ICD-10-CM | POA: Insufficient documentation

## 2019-01-18 DIAGNOSIS — K439 Ventral hernia without obstruction or gangrene: Secondary | ICD-10-CM | POA: Diagnosis not present

## 2019-01-18 DIAGNOSIS — Z01818 Encounter for other preprocedural examination: Secondary | ICD-10-CM | POA: Insufficient documentation

## 2019-01-18 DIAGNOSIS — I440 Atrioventricular block, first degree: Secondary | ICD-10-CM | POA: Diagnosis not present

## 2019-01-18 LAB — CBC
HCT: 41.8 % (ref 36.0–46.0)
Hemoglobin: 14.5 g/dL (ref 12.0–15.0)
MCH: 32.4 pg (ref 26.0–34.0)
MCHC: 34.7 g/dL (ref 30.0–36.0)
MCV: 93.5 fL (ref 80.0–100.0)
Platelets: 313 10*3/uL (ref 150–400)
RBC: 4.47 MIL/uL (ref 3.87–5.11)
RDW: 12.2 % (ref 11.5–15.5)
WBC: 7.6 10*3/uL (ref 4.0–10.5)
nRBC: 0 % (ref 0.0–0.2)

## 2019-01-18 LAB — NOVEL CORONAVIRUS, NAA (HOSP ORDER, SEND-OUT TO REF LAB; TAT 18-24 HRS): SARS-CoV-2, NAA: NOT DETECTED

## 2019-01-18 LAB — BASIC METABOLIC PANEL
Anion gap: 9 (ref 5–15)
BUN: 22 mg/dL (ref 8–23)
CO2: 26 mmol/L (ref 22–32)
Calcium: 9.4 mg/dL (ref 8.9–10.3)
Chloride: 106 mmol/L (ref 98–111)
Creatinine, Ser: 0.8 mg/dL (ref 0.44–1.00)
GFR calc Af Amer: >60 mL/min (ref >60)
GFR calc non Af Amer: >60 mL/min (ref >60)
Glucose, Bld: 122 mg/dL — ABNORMAL HIGH (ref 70–99)
Potassium: 4.2 mmol/L (ref 3.5–5.1)
Sodium: 141 mmol/L (ref 135–145)

## 2019-01-18 LAB — HEMOGLOBIN A1C
Hgb A1c MFr Bld: 6.4 % — ABNORMAL HIGH (ref 4.8–5.6)
Mean Plasma Glucose: 136.98 mg/dL

## 2019-01-18 LAB — GLUCOSE, CAPILLARY: Glucose-Capillary: 97 mg/dL (ref 70–99)

## 2019-01-18 NOTE — Progress Notes (Signed)
PCP - Dr. Deland Pretty Cardiologist - no  Chest x-ray - no EKG - 01/18/19 Stress Test - no ECHO - no Cardiac Cath - no  Sleep Study - noCPAP -   Fasting Blood Sugar - 120-130 Checks Blood Sugar _QD____ times a day  Blood Thinner Instructions:ASA Aspirin Instructions:no instructions given  Last Dose:01/18/19  Anesthesia review:   Patient denies shortness of breath, fever, cough and chest pain at PAT appointment   Patient verbalized understanding of instructions that were given to them at the PAT appointment. Patient was also instructed that they will need to review over the PAT instructions again at home before surgery.

## 2019-01-19 MED ORDER — BUPIVACAINE LIPOSOME 1.3 % IJ SUSP
20.0000 mL | Freq: Once | INTRAMUSCULAR | Status: DC
  Filled 2019-01-19: qty 20

## 2019-01-19 NOTE — Anesthesia Preprocedure Evaluation (Addendum)
Anesthesia Evaluation  Patient identified by MRN, date of birth, ID band Patient awake    Reviewed: Allergy & Precautions, NPO status , Patient's Chart, lab work & pertinent test results  Airway Mallampati: II  TM Distance: >3 FB Neck ROM: Full    Dental  (+) Dental Advisory Given, Teeth Intact   Pulmonary neg pulmonary ROS,    Pulmonary exam normal breath sounds clear to auscultation       Cardiovascular Exercise Tolerance: Good hypertension, Pt. on medications Normal cardiovascular exam Rhythm:Regular Rate:Normal     Neuro/Psych negative neurological ROS  negative psych ROS   GI/Hepatic negative GI ROS, Neg liver ROS,   Endo/Other  diabetes  Renal/GU negative Renal ROS     Musculoskeletal  (+) Arthritis ,   Abdominal   Peds  Hematology negative hematology ROS (+)   Anesthesia Other Findings   Reproductive/Obstetrics negative OB ROS                           Anesthesia Physical Anesthesia Plan  ASA: III  Anesthesia Plan: General   Post-op Pain Management:    Induction: Intravenous  PONV Risk Score and Plan: 4 or greater and Ondansetron, Dexamethasone and Treatment may vary due to age or medical condition  Airway Management Planned: Oral ETT  Additional Equipment: None  Intra-op Plan:   Post-operative Plan: Extubation in OR  Informed Consent: I have reviewed the patients History and Physical, chart, labs and discussed the procedure including the risks, benefits and alternatives for the proposed anesthesia with the patient or authorized representative who has indicated his/her understanding and acceptance.     Dental advisory given  Plan Discussed with: CRNA  Anesthesia Plan Comments:        Anesthesia Quick Evaluation

## 2019-01-20 ENCOUNTER — Other Ambulatory Visit: Payer: Self-pay

## 2019-01-20 ENCOUNTER — Ambulatory Visit (HOSPITAL_COMMUNITY): Payer: Medicare Other | Admitting: Anesthesiology

## 2019-01-20 ENCOUNTER — Encounter (HOSPITAL_COMMUNITY)
Admission: RE | Disposition: A | Discharge status: Home / Self Care | Payer: Self-pay | Source: Home / Self Care | Attending: Surgery

## 2019-01-20 ENCOUNTER — Ambulatory Visit (HOSPITAL_COMMUNITY)
Admission: RE | Admit: 2019-01-20 | Discharge: 2019-01-20 | Disposition: A | Discharge status: Home / Self Care | Payer: Medicare Other | Attending: Surgery | Admitting: Surgery

## 2019-01-20 ENCOUNTER — Encounter (HOSPITAL_COMMUNITY): Payer: Self-pay | Admitting: *Deleted

## 2019-01-20 ENCOUNTER — Ambulatory Visit (HOSPITAL_COMMUNITY): Payer: Medicare Other | Admitting: Physician Assistant

## 2019-01-20 DIAGNOSIS — E7800 Pure hypercholesterolemia, unspecified: Secondary | ICD-10-CM | POA: Diagnosis not present

## 2019-01-20 DIAGNOSIS — M1990 Unspecified osteoarthritis, unspecified site: Secondary | ICD-10-CM | POA: Diagnosis not present

## 2019-01-20 DIAGNOSIS — K439 Ventral hernia without obstruction or gangrene: Secondary | ICD-10-CM | POA: Diagnosis present

## 2019-01-20 DIAGNOSIS — Z90710 Acquired absence of both cervix and uterus: Secondary | ICD-10-CM | POA: Insufficient documentation

## 2019-01-20 DIAGNOSIS — I10 Essential (primary) hypertension: Secondary | ICD-10-CM | POA: Diagnosis not present

## 2019-01-20 DIAGNOSIS — E119 Type 2 diabetes mellitus without complications: Secondary | ICD-10-CM | POA: Diagnosis not present

## 2019-01-20 HISTORY — PX: VENTRAL HERNIA REPAIR: SHX424

## 2019-01-20 LAB — GLUCOSE, CAPILLARY
Glucose-Capillary: 157 mg/dL — ABNORMAL HIGH (ref 70–99)
Glucose-Capillary: 142 mg/dL — ABNORMAL HIGH (ref 70–99)

## 2019-01-20 SURGERY — REPAIR, HERNIA, VENTRAL, LAPAROSCOPIC
Anesthesia: General | Site: Abdomen | Laterality: Right

## 2019-01-20 MED ORDER — FENTANYL CITRATE (PF) 100 MCG/2ML IJ SOLN
INTRAMUSCULAR | Status: AC
  Filled 2019-01-20: qty 2

## 2019-01-20 MED ORDER — DEXAMETHASONE SODIUM PHOSPHATE 10 MG/ML IJ SOLN
INTRAMUSCULAR | Status: DC | PRN
  Administered 2019-01-20: 4 mg via INTRAVENOUS

## 2019-01-20 MED ORDER — GLYCOPYRROLATE PF 0.2 MG/ML IJ SOSY
PREFILLED_SYRINGE | INTRAMUSCULAR | Status: AC
  Filled 2019-01-20: qty 1

## 2019-01-20 MED ORDER — ONDANSETRON HCL 4 MG/2ML IJ SOLN
INTRAMUSCULAR | Status: AC
  Filled 2019-01-20: qty 2

## 2019-01-20 MED ORDER — LIDOCAINE HCL (CARDIAC) PF 100 MG/5ML IV SOSY
PREFILLED_SYRINGE | INTRAVENOUS | Status: DC | PRN
  Administered 2019-01-20: 60 mg via INTRAVENOUS

## 2019-01-20 MED ORDER — CHLORHEXIDINE GLUCONATE CLOTH 2 % EX PADS: 6.0000 | MEDICATED_PAD | Freq: Once | CUTANEOUS | Status: DC

## 2019-01-20 MED ORDER — PHENYLEPHRINE 40 MCG/ML (10ML) SYRINGE FOR IV PUSH (FOR BLOOD PRESSURE SUPPORT)
PREFILLED_SYRINGE | INTRAVENOUS | Status: AC
  Filled 2019-01-20: qty 10

## 2019-01-20 MED ORDER — PROPOFOL 10 MG/ML IV BOLUS
INTRAVENOUS | Status: AC
  Filled 2019-01-20: qty 20

## 2019-01-20 MED ORDER — BUPIVACAINE LIPOSOME 1.3 % IJ SUSP
INTRAMUSCULAR | Status: DC | PRN
  Administered 2019-01-20: 20 mL

## 2019-01-20 MED ORDER — DEXAMETHASONE SODIUM PHOSPHATE 10 MG/ML IJ SOLN
INTRAMUSCULAR | Status: AC
  Filled 2019-01-20: qty 1

## 2019-01-20 MED ORDER — PROPOFOL 10 MG/ML IV BOLUS
INTRAVENOUS | Status: DC | PRN
  Administered 2019-01-20: 30 mg via INTRAVENOUS
  Administered 2019-01-20: 40 mg via INTRAVENOUS

## 2019-01-20 MED ORDER — ROCURONIUM BROMIDE 100 MG/10ML IV SOLN
INTRAVENOUS | Status: DC | PRN
  Administered 2019-01-20: 40 mg via INTRAVENOUS
  Administered 2019-01-20 (×2): 10 mg via INTRAVENOUS

## 2019-01-20 MED ORDER — PROMETHAZINE HCL 25 MG/ML IJ SOLN: 6.2500 mg | INTRAMUSCULAR | Status: DC | PRN

## 2019-01-20 MED ORDER — 0.9 % SODIUM CHLORIDE (POUR BTL) OPTIME
TOPICAL | Status: DC | PRN
  Administered 2019-01-20: 1000 mL

## 2019-01-20 MED ORDER — HYDROCODONE-ACETAMINOPHEN 5-325 MG PO TABS: 1.0000 | ORAL_TABLET | Freq: Four times a day (QID) | ORAL | 0 refills | Status: DC | PRN

## 2019-01-20 MED ORDER — ONDANSETRON HCL 4 MG/2ML IJ SOLN
INTRAMUSCULAR | Status: DC | PRN
  Administered 2019-01-20: 4 mg via INTRAVENOUS

## 2019-01-20 MED ORDER — ONDANSETRON HCL 4 MG/2ML IJ SOLN
4.0000 mg | Freq: Once | INTRAMUSCULAR | Status: AC
  Administered 2019-01-20: 4 mg via INTRAVENOUS

## 2019-01-20 MED ORDER — EPHEDRINE SULFATE 50 MG/ML IJ SOLN
INTRAMUSCULAR | Status: DC | PRN
  Administered 2019-01-20: 10 mg via INTRAVENOUS

## 2019-01-20 MED ORDER — MEPERIDINE HCL 50 MG/ML IJ SOLN: 6.2500 mg | INTRAMUSCULAR | Status: DC | PRN

## 2019-01-20 MED ORDER — FENTANYL CITRATE (PF) 100 MCG/2ML IJ SOLN: 25.0000 ug | INTRAMUSCULAR | Status: DC | PRN

## 2019-01-20 MED ORDER — PHENYLEPHRINE HCL (PRESSORS) 10 MG/ML IV SOLN
INTRAVENOUS | Status: DC | PRN
  Administered 2019-01-20 (×2): 40 ug via INTRAVENOUS

## 2019-01-20 MED ORDER — LIDOCAINE 2% (20 MG/ML) 5 ML SYRINGE
INTRAMUSCULAR | Status: AC
  Filled 2019-01-20: qty 5

## 2019-01-20 MED ORDER — SUGAMMADEX SODIUM 200 MG/2ML IV SOLN
INTRAVENOUS | Status: DC | PRN
  Administered 2019-01-20: 125 mg via INTRAVENOUS

## 2019-01-20 MED ORDER — GLYCOPYRROLATE 0.2 MG/ML IJ SOLN
INTRAMUSCULAR | Status: DC | PRN
  Administered 2019-01-20: .2 mg via INTRAVENOUS

## 2019-01-20 MED ORDER — LACTATED RINGERS IV SOLN
INTRAVENOUS | Status: DC
  Administered 2019-01-20: 06:00:00 via INTRAVENOUS

## 2019-01-20 MED ORDER — HEPARIN SODIUM (PORCINE) 5000 UNIT/ML IJ SOLN
5000.0000 [IU] | Freq: Once | INTRAMUSCULAR | Status: AC
  Administered 2019-01-20: 5000 [IU] via SUBCUTANEOUS
  Filled 2019-01-20: qty 1

## 2019-01-20 MED ORDER — FENTANYL CITRATE (PF) 100 MCG/2ML IJ SOLN
INTRAMUSCULAR | Status: DC | PRN
  Administered 2019-01-20 (×2): 50 ug via INTRAVENOUS

## 2019-01-20 MED ORDER — ACETAMINOPHEN 500 MG PO TABS
1000.0000 mg | ORAL_TABLET | ORAL | Status: AC
  Administered 2019-01-20: 1000 mg via ORAL
  Filled 2019-01-20: qty 2

## 2019-01-20 MED ORDER — EPHEDRINE 5 MG/ML INJ
INTRAVENOUS | Status: AC
  Filled 2019-01-20: qty 10

## 2019-01-20 MED ORDER — BUPIVACAINE-EPINEPHRINE 0.25% -1:200000 IJ SOLN
INTRAMUSCULAR | Status: AC
  Filled 2019-01-20: qty 1

## 2019-01-20 MED ORDER — ROCURONIUM BROMIDE 10 MG/ML (PF) SYRINGE
PREFILLED_SYRINGE | INTRAVENOUS | Status: AC
  Filled 2019-01-20: qty 10

## 2019-01-20 MED ORDER — LACTATED RINGERS IR SOLN
Status: DC | PRN
  Administered 2019-01-20: 1000 mL

## 2019-01-20 MED ORDER — CEFAZOLIN SODIUM-DEXTROSE 2-4 GM/100ML-% IV SOLN
2.0000 g | INTRAVENOUS | Status: AC
  Administered 2019-01-20: 08:00:00 2 g via INTRAVENOUS
  Filled 2019-01-20: qty 100

## 2019-01-20 SURGICAL SUPPLY — 35 items
BINDER ABDOMINAL 12 ML 46-62 (SOFTGOODS) IMPLANT
CABLE HIGH FREQUENCY MONO STRZ (ELECTRODE) ×3 IMPLANT
CHLORAPREP W/TINT 26 (MISCELLANEOUS) ×3 IMPLANT
COVER SURGICAL LIGHT HANDLE (MISCELLANEOUS) ×3 IMPLANT
COVER WAND RF STERILE (DRAPES) IMPLANT
DECANTER SPIKE VIAL GLASS SM (MISCELLANEOUS) IMPLANT
DERMABOND ADVANCED (GAUZE/BANDAGES/DRESSINGS) ×4
DERMABOND ADVANCED .7 DNX12 (GAUZE/BANDAGES/DRESSINGS) ×2 IMPLANT
DEVICE SECURE STRAP 25 ABSORB (INSTRUMENTS) IMPLANT
DEVICE TROCAR PUNCTURE CLOSURE (ENDOMECHANICALS) ×3 IMPLANT
DISSECTOR BLUNT TIP ENDO 5MM (MISCELLANEOUS) IMPLANT
ELECT PENCIL ROCKER SW 15FT (MISCELLANEOUS) ×3 IMPLANT
ELECT REM PT RETURN 15FT ADLT (MISCELLANEOUS) ×3 IMPLANT
GLOVE BIOGEL M 8.0 STRL (GLOVE) ×3 IMPLANT
GOWN STRL REUS W/TWL XL LVL3 (GOWN DISPOSABLE) ×9 IMPLANT
KIT BASIN OR (CUSTOM PROCEDURE TRAY) ×3 IMPLANT
KIT TURNOVER KIT A (KITS) IMPLANT
MARKER SKIN DUAL TIP RULER LAB (MISCELLANEOUS) ×3 IMPLANT
MESH VENTRALEX ST 1-7/10 CRC S (Mesh General) ×3 IMPLANT
NEEDLE SPNL 22GX3.5 QUINCKE BK (NEEDLE) ×3 IMPLANT
SCRUB TECHNI CARE 4 OZ NO DYE (MISCELLANEOUS) ×3 IMPLANT
SET IRRIG TUBING LAPAROSCOPIC (IRRIGATION / IRRIGATOR) ×3 IMPLANT
SET TUBE SMOKE EVAC HIGH FLOW (TUBING) ×3 IMPLANT
SHEARS HARMONIC ACE PLUS 45CM (MISCELLANEOUS) ×3 IMPLANT
SLEEVE XCEL OPT CAN 5 100 (ENDOMECHANICALS) ×6 IMPLANT
STAPLER VISISTAT 35W (STAPLE) ×3 IMPLANT
SUT NOVA NAB DX-16 0-1 5-0 T12 (SUTURE) ×6 IMPLANT
SUT VIC AB 4-0 SH 18 (SUTURE) ×3 IMPLANT
TACKER 5MM HERNIA 3.5CML NAB (ENDOMECHANICALS) IMPLANT
TOWEL OR 17X26 10 PK STRL BLUE (TOWEL DISPOSABLE) ×3 IMPLANT
TOWEL OR NON WOVEN STRL DISP B (DISPOSABLE) ×3 IMPLANT
TRAY FOLEY MTR SLVR 16FR STAT (SET/KITS/TRAYS/PACK) IMPLANT
TRAY LAPAROSCOPIC (CUSTOM PROCEDURE TRAY) ×3 IMPLANT
TROCAR BLADELESS OPT 5 100 (ENDOMECHANICALS) ×3 IMPLANT
TROCAR XCEL NON-BLD 11X100MML (ENDOMECHANICALS) IMPLANT

## 2019-01-20 NOTE — Transfer of Care (Signed)
Immediate Anesthesia Transfer of Care Note  Patient: Sherry Oconnor  Procedure(s) Performed: LAPAROSCOPIC RIGHT SPIGELAIN HERNIA REPAIR WITH MESH (Right Abdomen)  Patient Location: PACU  Anesthesia Type:General  Level of Consciousness: drowsy, patient cooperative and responds to stimulation  Airway & Oxygen Therapy: Patient Spontanous Breathing and Patient connected to face mask oxygen  Post-op Assessment: Report given to RN and Post -op Vital signs reviewed and stable  Post vital signs: Reviewed and stable  Last Vitals:  Vitals Value Taken Time  BP    Temp    Pulse 94 01/20/19 0924  Resp 15 01/20/19 0924  SpO2 100 % 01/20/19 0924  Vitals shown include unvalidated device data.  Last Pain:  Vitals:   01/20/19 0600  TempSrc: Oral      Patients Stated Pain Goal: 3 ([id number])  Complications: No apparent anesthesia complications

## 2019-01-20 NOTE — Discharge Instructions (Signed)
Ventral Hernia  A ventral hernia is a bulge of tissue from inside the abdomen that pushes through a weak area of the muscles that form the front wall of the abdomen. The tissues inside the abdomen are inside a sac (peritoneum). These tissues include the small intestine, large intestine, and the fatty tissue that covers the intestines (omentum). Sometimes, the bulge that forms a hernia contains intestines. Other hernias contain only fat. Ventral hernias do not go away without surgical treatment. There are several types of ventral hernias. You may have:  A hernia at an incision site from previous abdominal surgery (incisional hernia).  A hernia just above the belly button (epigastric hernia), or at the belly button (umbilical hernia). These types of hernias can develop from heavy lifting or straining.  A hernia that comes and goes (reducible hernia). It may be visible only when you lift or strain. This type of hernia can be pushed back into the abdomen (reduced).  A hernia that traps abdominal tissue inside the hernia (incarcerated hernia). This type of hernia does not reduce.  A hernia that cuts off blood flow to the tissues inside the hernia (strangulated hernia). The tissues can start to die if this happens. This is a very painful bulge that cannot be reduced. A strangulated hernia is a medical emergency. What are the causes? This condition is caused by abdominal tissue putting pressure on an area of weakness in the abdominal muscles. What increases the risk? The following factors may make you more likely to develop this condition:  Being female.  Being 60 or older.  Being overweight or obese.  Having had previous abdominal surgery, especially if there was an infection after surgery.  Having had an injury to the abdominal wall.  Having had several pregnancies.  Having a buildup of fluid inside the abdomen (ascites). What are the signs or symptoms? The only symptom of a ventral hernia  may be a painless bulge in the abdomen. A reducible hernia may be visible only when you strain, cough, or lift. Other symptoms may include:  Dull pain.  A feeling of pressure. Signs and symptoms of a strangulated hernia may include:  Increasing pain.  Nausea and vomiting.  Pain when pressing on the hernia.  The skin over the hernia turning red or purple.  Constipation.  Blood in the stool (feces). How is this diagnosed? This condition may be diagnosed based on:  Your symptoms.  Your medical history.  A physical exam. You may be asked to cough or strain while standing. These actions increase the pressure inside your abdomen and force the hernia through the opening in your muscles. Your health care provider may try to reduce the hernia by pressing on it.  Imaging studies, such as an ultrasound or CT scan. How is this treated? This condition is treated with surgery. If you have a strangulated hernia, surgery is done as soon as possible. If your hernia is small and not incarcerated, you may be asked to lose some weight before surgery. Follow these instructions at home:  Follow instructions from your health care provider about eating or drinking restrictions.  If you are overweight, your health care provider may recommend that you increase your activity level and eat a healthier diet.  Do not lift anything that is heavier than 10 lb (4.5 kg).  Return to your normal activities as told by your health care provider. Ask your health care provider what activities are safe for you. You may need to avoid activities   that increase pressure on your hernia.  Take over-the-counter and prescription medicines only as told by your health care provider.  Keep all follow-up visits as told by your health care provider. This is important. Contact a health care provider if:  Your hernia gets larger.  Your hernia becomes painful. Get help right away if:  Your hernia becomes increasingly  painful.  You have pain along with any of the following: ? Changes in skin color in the area of the hernia. ? Nausea. ? Vomiting. ? Fever. Summary  A ventral hernia is a bulge of tissue from inside the abdomen that pushes through a weak area of the muscles that form the front wall of the abdomen.  This condition is treated with surgery, which may be urgent depending on your hernia.  Do not lift anything that is heavier than 10 lb (4.5 kg), and follow activity instructions from your health care provider. This information is not intended to replace advice given to you by your health care provider. Make sure you discuss any questions you have with your health care provider. Document Released: 02/04/2012 Document Revised: 04/01/2017 Document Reviewed: 09/08/2016 Elsevier Patient Education  2020 [employer]  

## 2019-01-20 NOTE — Anesthesia Procedure Notes (Signed)
Procedure Name: Intubation Date/Time: 01/20/2019 7:45 AM Performed by: Glory Buff, CRNA Pre-anesthesia Checklist: Patient identified, Emergency Drugs available, Suction available and Patient being monitored Patient Re-evaluated:Patient Re-evaluated prior to induction Oxygen Delivery Method: Circle system utilized Preoxygenation: Pre-oxygenation with 100% oxygen Induction Type: IV induction Ventilation: Mask ventilation without difficulty Laryngoscope Size: Miller and 3 Grade View: Grade II Tube type: Oral Tube size: 7.0 mm Number of attempts: 1 Airway Equipment and Method: Stylet and Oral airway Placement Confirmation: ETT inserted through vocal cords under direct vision,  positive ETCO2 and breath sounds checked- equal and bilateral Secured at: 20 cm Tube secured with: Tape Dental Injury: Teeth and Oropharynx as per pre-operative assessment

## 2019-01-20 NOTE — Op Note (Signed)
Ted Alejandro  [birth year] 20 January 2019    PCP:  Harlan Stains, MD   Surgeon: Kaylyn Lim, MD, FACS  Asst:  none  Anes:  general  Preop Dx: Right Spigelian hernia Postop Dx: Right Spigelian hernia and adhesions  Procedure: Lap assisted repair of Spigelian hernia and takedown of adhesions to the midline Location Surgery: [facility] Complications: None noted  EBL:   minimal cc  Drains: none  Description of Procedure:  The patient was taken to OR 4 .  After anesthesia was administered and the patient was prepped  with Chloroprep and a timeout was performed.  Access to the abdomen was achieved with a 5 mm Optiview through the left upper quadrant.  A second LLQ trocar 5 mm was placed.  There were a lot of omental adhesions to the midline and these were taken down with the Harmonic scalpel.  Hemostasis was present.    The hernia was then opened with a transverse incision.  The sac was dissected free and I didn't enter the abdomen.  Below the fascia I freed the peritoneum for ~ 2 cm.  I got the small Ventralex mesh patch and placed it beneath the fascia and it was still covered by the peritoneum. It was secured with 0 Novafil and then the fascia was closed over the mesh.  Exparel was injected in the ports and the fascial hernia repair.     The patient tolerated the procedure well and was taken to the PACU in stable condition.     Matt B. Hassell Done, [place], [facility], [place]

## 2019-01-20 NOTE — Interval H&P Note (Signed)
History and Physical Interval Note:  01/20/2019 7:28 AM  [name]  has presented today for surgery, with the diagnosis of RIGHT SPIGELIAN HERNIA.  The various methods of treatment have been discussed with the patient and family. After consideration of risks, benefits and other options for treatment, the patient has consented to  Procedure(s): [place] (Right) as a surgical intervention.  The patient's history has been reviewed, patient examined, no change in status, stable for surgery.  I have reviewed the patient's chart and labs.  Questions were answered to the patient's satisfaction.     Sherry Oconnor

## 2019-01-20 NOTE — Anesthesia Postprocedure Evaluation (Signed)
Anesthesia Post Note  Patient: Sherry Oconnor  Procedure(s) Performed: LAPAROSCOPIC RIGHT SPIGELAIN HERNIA REPAIR WITH MESH (Right Abdomen)     Patient location during evaluation: PACU Anesthesia Type: General Level of consciousness: sedated and patient cooperative Pain management: pain level controlled Vital Signs Assessment: post-procedure vital signs reviewed and stable Respiratory status: spontaneous breathing Cardiovascular status: stable Anesthetic complications: no    Last Vitals:  Vitals:   01/20/19 1000 01/20/19 1035  BP:  120/62  Pulse:  71  Resp:    Temp: 36.7 C (!) 36.4 C  SpO2:  96%    Last Pain:  Vitals:   01/20/19 1035  TempSrc: Oral  PainSc:                  Nolon Nations

## 2019-01-21 ENCOUNTER — Encounter (HOSPITAL_COMMUNITY): Payer: Self-pay | Admitting: Surgery

## 2019-03-18 DIAGNOSIS — E119 Type 2 diabetes mellitus without complications: Secondary | ICD-10-CM | POA: Diagnosis not present

## 2019-03-18 DIAGNOSIS — H5203 Hypermetropia, bilateral: Secondary | ICD-10-CM | POA: Diagnosis not present

## 2019-04-11 DIAGNOSIS — Z7984 Long term (current) use of oral hypoglycemic drugs: Secondary | ICD-10-CM | POA: Diagnosis not present

## 2019-04-11 DIAGNOSIS — I10 Essential (primary) hypertension: Secondary | ICD-10-CM | POA: Diagnosis not present

## 2019-04-11 DIAGNOSIS — E1169 Type 2 diabetes mellitus with other specified complication: Secondary | ICD-10-CM | POA: Diagnosis not present

## 2019-04-11 DIAGNOSIS — E785 Hyperlipidemia, unspecified: Secondary | ICD-10-CM | POA: Diagnosis not present

## 2019-04-13 DIAGNOSIS — L820 Inflamed seborrheic keratosis: Secondary | ICD-10-CM | POA: Diagnosis not present

## 2019-06-15 DIAGNOSIS — E559 Vitamin D deficiency, unspecified: Secondary | ICD-10-CM | POA: Diagnosis not present

## 2019-06-15 DIAGNOSIS — R490 Dysphonia: Secondary | ICD-10-CM | POA: Diagnosis not present

## 2019-06-15 DIAGNOSIS — E785 Hyperlipidemia, unspecified: Secondary | ICD-10-CM | POA: Diagnosis not present

## 2019-06-15 DIAGNOSIS — E1169 Type 2 diabetes mellitus with other specified complication: Secondary | ICD-10-CM | POA: Diagnosis not present

## 2019-06-15 DIAGNOSIS — E041 Nontoxic single thyroid nodule: Secondary | ICD-10-CM | POA: Diagnosis not present

## 2019-06-15 DIAGNOSIS — I10 Essential (primary) hypertension: Secondary | ICD-10-CM | POA: Diagnosis not present

## 2019-06-15 DIAGNOSIS — Z0000 Encounter for general adult medical examination without abnormal findings: Secondary | ICD-10-CM | POA: Diagnosis not present

## 2019-06-15 DIAGNOSIS — E049 Nontoxic goiter, unspecified: Secondary | ICD-10-CM | POA: Diagnosis not present

## 2019-06-15 DIAGNOSIS — E538 Deficiency of other specified B group vitamins: Secondary | ICD-10-CM | POA: Diagnosis not present

## 2019-06-15 DIAGNOSIS — F439 Reaction to severe stress, unspecified: Secondary | ICD-10-CM | POA: Diagnosis not present

## 2019-06-22 DIAGNOSIS — Z1231 Encounter for screening mammogram for malignant neoplasm of breast: Secondary | ICD-10-CM | POA: Diagnosis not present

## 2019-07-11 DIAGNOSIS — E042 Nontoxic multinodular goiter: Secondary | ICD-10-CM | POA: Diagnosis not present

## 2019-07-13 ENCOUNTER — Other Ambulatory Visit: Payer: Self-pay | Admitting: Surgery

## 2019-07-13 DIAGNOSIS — D440 Neoplasm of uncertain behavior of thyroid gland: Secondary | ICD-10-CM

## 2019-07-13 DIAGNOSIS — E042 Nontoxic multinodular goiter: Secondary | ICD-10-CM

## 2019-07-27 ENCOUNTER — Ambulatory Visit
Admission: RE | Admit: 2019-07-27 | Discharge: 2019-07-27 | Disposition: A | Discharge status: Home / Self Care | Payer: Medicare Other | Source: Ambulatory Visit | Attending: Surgery | Admitting: Surgery

## 2019-07-27 DIAGNOSIS — D440 Neoplasm of uncertain behavior of thyroid gland: Secondary | ICD-10-CM

## 2019-07-27 DIAGNOSIS — E042 Nontoxic multinodular goiter: Secondary | ICD-10-CM

## 2019-07-27 DIAGNOSIS — E041 Nontoxic single thyroid nodule: Secondary | ICD-10-CM | POA: Diagnosis not present

## 2019-08-23 ENCOUNTER — Other Ambulatory Visit: Payer: Self-pay | Admitting: Surgery

## 2019-08-23 DIAGNOSIS — E041 Nontoxic single thyroid nodule: Secondary | ICD-10-CM

## 2019-08-26 ENCOUNTER — Other Ambulatory Visit: Payer: Self-pay | Admitting: Surgery

## 2019-08-26 DIAGNOSIS — E041 Nontoxic single thyroid nodule: Secondary | ICD-10-CM

## 2019-09-08 ENCOUNTER — Ambulatory Visit
Admission: RE | Admit: 2019-09-08 | Discharge: 2019-09-08 | Disposition: A | Discharge status: Home / Self Care | Payer: Medicare Other | Source: Ambulatory Visit | Attending: Surgery | Admitting: Surgery

## 2019-09-08 ENCOUNTER — Other Ambulatory Visit: Payer: Self-pay

## 2019-09-08 ENCOUNTER — Other Ambulatory Visit (HOSPITAL_COMMUNITY)
Admission: RE | Admit: 2019-09-08 | Discharge: 2019-09-08 | Disposition: A | Discharge status: Home / Self Care | Payer: Medicare Other | Source: Ambulatory Visit | Attending: Physician Assistant | Admitting: Physician Assistant

## 2019-09-08 DIAGNOSIS — D497 Neoplasm of unspecified behavior of endocrine glands and other parts of nervous system: Secondary | ICD-10-CM | POA: Diagnosis not present

## 2019-09-08 DIAGNOSIS — E041 Nontoxic single thyroid nodule: Secondary | ICD-10-CM

## 2019-09-09 LAB — CYTOLOGY - NON PAP

## 2019-09-26 DIAGNOSIS — E1169 Type 2 diabetes mellitus with other specified complication: Secondary | ICD-10-CM | POA: Diagnosis not present

## 2019-09-26 DIAGNOSIS — I10 Essential (primary) hypertension: Secondary | ICD-10-CM | POA: Diagnosis not present

## 2019-09-26 DIAGNOSIS — E785 Hyperlipidemia, unspecified: Secondary | ICD-10-CM | POA: Diagnosis not present

## 2019-09-27 ENCOUNTER — Encounter (HOSPITAL_COMMUNITY): Payer: Self-pay

## 2019-12-15 DIAGNOSIS — Q766 Other congenital malformations of ribs: Secondary | ICD-10-CM | POA: Diagnosis not present

## 2019-12-15 DIAGNOSIS — E1169 Type 2 diabetes mellitus with other specified complication: Secondary | ICD-10-CM | POA: Diagnosis not present

## 2019-12-15 DIAGNOSIS — Z23 Encounter for immunization: Secondary | ICD-10-CM | POA: Diagnosis not present

## 2019-12-15 DIAGNOSIS — E559 Vitamin D deficiency, unspecified: Secondary | ICD-10-CM | POA: Diagnosis not present

## 2019-12-15 DIAGNOSIS — M79675 Pain in left toe(s): Secondary | ICD-10-CM | POA: Diagnosis not present

## 2019-12-15 DIAGNOSIS — E538 Deficiency of other specified B group vitamins: Secondary | ICD-10-CM | POA: Diagnosis not present

## 2019-12-15 DIAGNOSIS — I10 Essential (primary) hypertension: Secondary | ICD-10-CM | POA: Diagnosis not present

## 2019-12-15 DIAGNOSIS — E785 Hyperlipidemia, unspecified: Secondary | ICD-10-CM | POA: Diagnosis not present

## 2019-12-15 DIAGNOSIS — E049 Nontoxic goiter, unspecified: Secondary | ICD-10-CM | POA: Diagnosis not present

## 2019-12-16 DIAGNOSIS — E1169 Type 2 diabetes mellitus with other specified complication: Secondary | ICD-10-CM | POA: Diagnosis not present

## 2019-12-19 ENCOUNTER — Other Ambulatory Visit: Payer: Self-pay | Admitting: Family Medicine

## 2019-12-19 ENCOUNTER — Ambulatory Visit
Admission: RE | Admit: 2019-12-19 | Discharge: 2019-12-19 | Disposition: A | Discharge status: Home / Self Care | Payer: Medicare Other | Source: Ambulatory Visit | Attending: Family Medicine | Admitting: Family Medicine

## 2019-12-19 DIAGNOSIS — R918 Other nonspecific abnormal finding of lung field: Secondary | ICD-10-CM | POA: Diagnosis not present

## 2019-12-19 DIAGNOSIS — Q766 Other congenital malformations of ribs: Secondary | ICD-10-CM

## 2019-12-26 DIAGNOSIS — E785 Hyperlipidemia, unspecified: Secondary | ICD-10-CM | POA: Diagnosis not present

## 2019-12-26 DIAGNOSIS — I10 Essential (primary) hypertension: Secondary | ICD-10-CM | POA: Diagnosis not present

## 2019-12-26 DIAGNOSIS — E1169 Type 2 diabetes mellitus with other specified complication: Secondary | ICD-10-CM | POA: Diagnosis not present

## 2019-12-28 DIAGNOSIS — L570 Actinic keratosis: Secondary | ICD-10-CM | POA: Diagnosis not present

## 2019-12-28 DIAGNOSIS — X32XXXD Exposure to sunlight, subsequent encounter: Secondary | ICD-10-CM | POA: Diagnosis not present

## 2019-12-28 DIAGNOSIS — B070 Plantar wart: Secondary | ICD-10-CM | POA: Diagnosis not present

## 2019-12-30 DIAGNOSIS — Z23 Encounter for immunization: Secondary | ICD-10-CM | POA: Diagnosis not present

## 2020-03-20 DIAGNOSIS — S4292XA Fracture of left shoulder girdle, part unspecified, initial encounter for closed fracture: Secondary | ICD-10-CM | POA: Diagnosis not present

## 2020-03-20 DIAGNOSIS — S43005A Unspecified dislocation of left shoulder joint, initial encounter: Secondary | ICD-10-CM | POA: Diagnosis not present

## 2020-03-29 DIAGNOSIS — S43005A Unspecified dislocation of left shoulder joint, initial encounter: Secondary | ICD-10-CM | POA: Diagnosis not present

## 2020-03-29 DIAGNOSIS — S4292XA Fracture of left shoulder girdle, part unspecified, initial encounter for closed fracture: Secondary | ICD-10-CM | POA: Diagnosis not present

## 2020-04-03 DIAGNOSIS — M25422 Effusion, left elbow: Secondary | ICD-10-CM | POA: Diagnosis not present

## 2020-04-03 DIAGNOSIS — M25512 Pain in left shoulder: Secondary | ICD-10-CM | POA: Diagnosis not present

## 2020-04-03 DIAGNOSIS — M25312 Other instability, left shoulder: Secondary | ICD-10-CM | POA: Diagnosis not present

## 2020-04-03 DIAGNOSIS — M25612 Stiffness of left shoulder, not elsewhere classified: Secondary | ICD-10-CM | POA: Diagnosis not present

## 2020-04-05 DIAGNOSIS — I10 Essential (primary) hypertension: Secondary | ICD-10-CM | POA: Diagnosis not present

## 2020-04-05 DIAGNOSIS — M25512 Pain in left shoulder: Secondary | ICD-10-CM | POA: Diagnosis not present

## 2020-04-05 DIAGNOSIS — M25312 Other instability, left shoulder: Secondary | ICD-10-CM | POA: Diagnosis not present

## 2020-04-05 DIAGNOSIS — M25422 Effusion, left elbow: Secondary | ICD-10-CM | POA: Diagnosis not present

## 2020-04-05 DIAGNOSIS — E785 Hyperlipidemia, unspecified: Secondary | ICD-10-CM | POA: Diagnosis not present

## 2020-04-05 DIAGNOSIS — E1169 Type 2 diabetes mellitus with other specified complication: Secondary | ICD-10-CM | POA: Diagnosis not present

## 2020-04-05 DIAGNOSIS — M25612 Stiffness of left shoulder, not elsewhere classified: Secondary | ICD-10-CM | POA: Diagnosis not present

## 2020-04-12 DIAGNOSIS — M25512 Pain in left shoulder: Secondary | ICD-10-CM | POA: Diagnosis not present

## 2020-04-12 DIAGNOSIS — M25312 Other instability, left shoulder: Secondary | ICD-10-CM | POA: Diagnosis not present

## 2020-04-12 DIAGNOSIS — M25422 Effusion, left elbow: Secondary | ICD-10-CM | POA: Diagnosis not present

## 2020-04-12 DIAGNOSIS — M25612 Stiffness of left shoulder, not elsewhere classified: Secondary | ICD-10-CM | POA: Diagnosis not present

## 2020-04-17 ENCOUNTER — Other Ambulatory Visit: Payer: Self-pay | Admitting: Family Medicine

## 2020-04-17 ENCOUNTER — Ambulatory Visit
Admission: RE | Admit: 2020-04-17 | Discharge: 2020-04-17 | Disposition: A | Discharge status: Home / Self Care | Payer: Medicare Other | Source: Ambulatory Visit | Attending: Family Medicine | Admitting: Family Medicine

## 2020-04-17 DIAGNOSIS — Z8709 Personal history of other diseases of the respiratory system: Secondary | ICD-10-CM | POA: Diagnosis not present

## 2020-04-17 DIAGNOSIS — R918 Other nonspecific abnormal finding of lung field: Secondary | ICD-10-CM | POA: Diagnosis not present

## 2020-04-17 DIAGNOSIS — S4292XA Fracture of left shoulder girdle, part unspecified, initial encounter for closed fracture: Secondary | ICD-10-CM | POA: Diagnosis not present

## 2020-04-17 DIAGNOSIS — R911 Solitary pulmonary nodule: Secondary | ICD-10-CM

## 2020-04-17 DIAGNOSIS — S43005A Unspecified dislocation of left shoulder joint, initial encounter: Secondary | ICD-10-CM | POA: Diagnosis not present

## 2020-04-19 DIAGNOSIS — M25512 Pain in left shoulder: Secondary | ICD-10-CM | POA: Diagnosis not present

## 2020-04-19 DIAGNOSIS — M25312 Other instability, left shoulder: Secondary | ICD-10-CM | POA: Diagnosis not present

## 2020-04-19 DIAGNOSIS — M25612 Stiffness of left shoulder, not elsewhere classified: Secondary | ICD-10-CM | POA: Diagnosis not present

## 2020-04-19 DIAGNOSIS — M25422 Effusion, left elbow: Secondary | ICD-10-CM | POA: Diagnosis not present

## 2020-04-24 DIAGNOSIS — M25612 Stiffness of left shoulder, not elsewhere classified: Secondary | ICD-10-CM | POA: Diagnosis not present

## 2020-04-24 DIAGNOSIS — M25422 Effusion, left elbow: Secondary | ICD-10-CM | POA: Diagnosis not present

## 2020-04-24 DIAGNOSIS — M25512 Pain in left shoulder: Secondary | ICD-10-CM | POA: Diagnosis not present

## 2020-04-24 DIAGNOSIS — M25312 Other instability, left shoulder: Secondary | ICD-10-CM | POA: Diagnosis not present

## 2020-04-26 DIAGNOSIS — M25312 Other instability, left shoulder: Secondary | ICD-10-CM | POA: Diagnosis not present

## 2020-04-26 DIAGNOSIS — M25422 Effusion, left elbow: Secondary | ICD-10-CM | POA: Diagnosis not present

## 2020-04-26 DIAGNOSIS — M25512 Pain in left shoulder: Secondary | ICD-10-CM | POA: Diagnosis not present

## 2020-04-26 DIAGNOSIS — M25612 Stiffness of left shoulder, not elsewhere classified: Secondary | ICD-10-CM | POA: Diagnosis not present

## 2020-05-01 DIAGNOSIS — M25312 Other instability, left shoulder: Secondary | ICD-10-CM | POA: Diagnosis not present

## 2020-05-01 DIAGNOSIS — M25512 Pain in left shoulder: Secondary | ICD-10-CM | POA: Diagnosis not present

## 2020-05-01 DIAGNOSIS — M25612 Stiffness of left shoulder, not elsewhere classified: Secondary | ICD-10-CM | POA: Diagnosis not present

## 2020-05-01 DIAGNOSIS — M25422 Effusion, left elbow: Secondary | ICD-10-CM | POA: Diagnosis not present

## 2020-05-03 DIAGNOSIS — M25422 Effusion, left elbow: Secondary | ICD-10-CM | POA: Diagnosis not present

## 2020-05-03 DIAGNOSIS — M25312 Other instability, left shoulder: Secondary | ICD-10-CM | POA: Diagnosis not present

## 2020-05-03 DIAGNOSIS — M25512 Pain in left shoulder: Secondary | ICD-10-CM | POA: Diagnosis not present

## 2020-05-03 DIAGNOSIS — M25612 Stiffness of left shoulder, not elsewhere classified: Secondary | ICD-10-CM | POA: Diagnosis not present

## 2020-05-07 DIAGNOSIS — M25512 Pain in left shoulder: Secondary | ICD-10-CM | POA: Diagnosis not present

## 2020-05-07 DIAGNOSIS — M25422 Effusion, left elbow: Secondary | ICD-10-CM | POA: Diagnosis not present

## 2020-05-07 DIAGNOSIS — M25312 Other instability, left shoulder: Secondary | ICD-10-CM | POA: Diagnosis not present

## 2020-05-07 DIAGNOSIS — M25612 Stiffness of left shoulder, not elsewhere classified: Secondary | ICD-10-CM | POA: Diagnosis not present

## 2020-05-09 DIAGNOSIS — H5203 Hypermetropia, bilateral: Secondary | ICD-10-CM | POA: Diagnosis not present

## 2020-05-09 DIAGNOSIS — H524 Presbyopia: Secondary | ICD-10-CM | POA: Diagnosis not present

## 2020-05-09 DIAGNOSIS — E119 Type 2 diabetes mellitus without complications: Secondary | ICD-10-CM | POA: Diagnosis not present

## 2020-05-10 DIAGNOSIS — M25612 Stiffness of left shoulder, not elsewhere classified: Secondary | ICD-10-CM | POA: Diagnosis not present

## 2020-05-10 DIAGNOSIS — M25312 Other instability, left shoulder: Secondary | ICD-10-CM | POA: Diagnosis not present

## 2020-05-10 DIAGNOSIS — M25422 Effusion, left elbow: Secondary | ICD-10-CM | POA: Diagnosis not present

## 2020-05-10 DIAGNOSIS — M25512 Pain in left shoulder: Secondary | ICD-10-CM | POA: Diagnosis not present

## 2020-05-14 DIAGNOSIS — M25512 Pain in left shoulder: Secondary | ICD-10-CM | POA: Diagnosis not present

## 2020-05-14 DIAGNOSIS — M25312 Other instability, left shoulder: Secondary | ICD-10-CM | POA: Diagnosis not present

## 2020-05-14 DIAGNOSIS — M25422 Effusion, left elbow: Secondary | ICD-10-CM | POA: Diagnosis not present

## 2020-05-14 DIAGNOSIS — M25612 Stiffness of left shoulder, not elsewhere classified: Secondary | ICD-10-CM | POA: Diagnosis not present

## 2020-05-15 DIAGNOSIS — S43005A Unspecified dislocation of left shoulder joint, initial encounter: Secondary | ICD-10-CM | POA: Diagnosis not present

## 2020-05-15 DIAGNOSIS — S4292XA Fracture of left shoulder girdle, part unspecified, initial encounter for closed fracture: Secondary | ICD-10-CM | POA: Diagnosis not present

## 2020-05-17 DIAGNOSIS — M25312 Other instability, left shoulder: Secondary | ICD-10-CM | POA: Diagnosis not present

## 2020-05-17 DIAGNOSIS — M25512 Pain in left shoulder: Secondary | ICD-10-CM | POA: Diagnosis not present

## 2020-05-17 DIAGNOSIS — M25422 Effusion, left elbow: Secondary | ICD-10-CM | POA: Diagnosis not present

## 2020-05-17 DIAGNOSIS — M25612 Stiffness of left shoulder, not elsewhere classified: Secondary | ICD-10-CM | POA: Diagnosis not present

## 2020-05-21 DIAGNOSIS — M25512 Pain in left shoulder: Secondary | ICD-10-CM | POA: Diagnosis not present

## 2020-05-21 DIAGNOSIS — M25612 Stiffness of left shoulder, not elsewhere classified: Secondary | ICD-10-CM | POA: Diagnosis not present

## 2020-05-21 DIAGNOSIS — M25422 Effusion, left elbow: Secondary | ICD-10-CM | POA: Diagnosis not present

## 2020-05-21 DIAGNOSIS — M25312 Other instability, left shoulder: Secondary | ICD-10-CM | POA: Diagnosis not present

## 2020-05-24 DIAGNOSIS — M25312 Other instability, left shoulder: Secondary | ICD-10-CM | POA: Diagnosis not present

## 2020-05-24 DIAGNOSIS — M25512 Pain in left shoulder: Secondary | ICD-10-CM | POA: Diagnosis not present

## 2020-05-24 DIAGNOSIS — M25612 Stiffness of left shoulder, not elsewhere classified: Secondary | ICD-10-CM | POA: Diagnosis not present

## 2020-05-24 DIAGNOSIS — M25422 Effusion, left elbow: Secondary | ICD-10-CM | POA: Diagnosis not present

## 2020-05-31 DIAGNOSIS — M25312 Other instability, left shoulder: Secondary | ICD-10-CM | POA: Diagnosis not present

## 2020-05-31 DIAGNOSIS — M25612 Stiffness of left shoulder, not elsewhere classified: Secondary | ICD-10-CM | POA: Diagnosis not present

## 2020-05-31 DIAGNOSIS — M25422 Effusion, left elbow: Secondary | ICD-10-CM | POA: Diagnosis not present

## 2020-05-31 DIAGNOSIS — M25512 Pain in left shoulder: Secondary | ICD-10-CM | POA: Diagnosis not present

## 2020-06-04 DIAGNOSIS — M25612 Stiffness of left shoulder, not elsewhere classified: Secondary | ICD-10-CM | POA: Diagnosis not present

## 2020-06-04 DIAGNOSIS — M25512 Pain in left shoulder: Secondary | ICD-10-CM | POA: Diagnosis not present

## 2020-06-04 DIAGNOSIS — M25422 Effusion, left elbow: Secondary | ICD-10-CM | POA: Diagnosis not present

## 2020-06-04 DIAGNOSIS — M25312 Other instability, left shoulder: Secondary | ICD-10-CM | POA: Diagnosis not present

## 2020-06-06 DIAGNOSIS — M25512 Pain in left shoulder: Secondary | ICD-10-CM | POA: Diagnosis not present

## 2020-06-06 DIAGNOSIS — M25422 Effusion, left elbow: Secondary | ICD-10-CM | POA: Diagnosis not present

## 2020-06-06 DIAGNOSIS — M25612 Stiffness of left shoulder, not elsewhere classified: Secondary | ICD-10-CM | POA: Diagnosis not present

## 2020-06-06 DIAGNOSIS — M25312 Other instability, left shoulder: Secondary | ICD-10-CM | POA: Diagnosis not present

## 2020-06-11 DIAGNOSIS — M25422 Effusion, left elbow: Secondary | ICD-10-CM | POA: Diagnosis not present

## 2020-06-11 DIAGNOSIS — M25312 Other instability, left shoulder: Secondary | ICD-10-CM | POA: Diagnosis not present

## 2020-06-11 DIAGNOSIS — M25612 Stiffness of left shoulder, not elsewhere classified: Secondary | ICD-10-CM | POA: Diagnosis not present

## 2020-06-11 DIAGNOSIS — M25512 Pain in left shoulder: Secondary | ICD-10-CM | POA: Diagnosis not present

## 2020-06-13 DIAGNOSIS — M25512 Pain in left shoulder: Secondary | ICD-10-CM | POA: Diagnosis not present

## 2020-06-13 DIAGNOSIS — M25612 Stiffness of left shoulder, not elsewhere classified: Secondary | ICD-10-CM | POA: Diagnosis not present

## 2020-06-13 DIAGNOSIS — M25312 Other instability, left shoulder: Secondary | ICD-10-CM | POA: Diagnosis not present

## 2020-06-13 DIAGNOSIS — M25422 Effusion, left elbow: Secondary | ICD-10-CM | POA: Diagnosis not present

## 2020-06-19 DIAGNOSIS — M25612 Stiffness of left shoulder, not elsewhere classified: Secondary | ICD-10-CM | POA: Diagnosis not present

## 2020-06-19 DIAGNOSIS — M25512 Pain in left shoulder: Secondary | ICD-10-CM | POA: Diagnosis not present

## 2020-06-19 DIAGNOSIS — M25422 Effusion, left elbow: Secondary | ICD-10-CM | POA: Diagnosis not present

## 2020-06-19 DIAGNOSIS — M25312 Other instability, left shoulder: Secondary | ICD-10-CM | POA: Diagnosis not present

## 2020-06-21 DIAGNOSIS — Z0000 Encounter for general adult medical examination without abnormal findings: Secondary | ICD-10-CM | POA: Diagnosis not present

## 2020-06-21 DIAGNOSIS — I10 Essential (primary) hypertension: Secondary | ICD-10-CM | POA: Diagnosis not present

## 2020-06-21 DIAGNOSIS — E785 Hyperlipidemia, unspecified: Secondary | ICD-10-CM | POA: Diagnosis not present

## 2020-06-21 DIAGNOSIS — E559 Vitamin D deficiency, unspecified: Secondary | ICD-10-CM | POA: Diagnosis not present

## 2020-06-21 DIAGNOSIS — E1169 Type 2 diabetes mellitus with other specified complication: Secondary | ICD-10-CM | POA: Diagnosis not present

## 2020-06-21 DIAGNOSIS — E049 Nontoxic goiter, unspecified: Secondary | ICD-10-CM | POA: Diagnosis not present

## 2020-06-21 DIAGNOSIS — E538 Deficiency of other specified B group vitamins: Secondary | ICD-10-CM | POA: Diagnosis not present

## 2020-06-22 DIAGNOSIS — M25422 Effusion, left elbow: Secondary | ICD-10-CM | POA: Diagnosis not present

## 2020-06-22 DIAGNOSIS — M25312 Other instability, left shoulder: Secondary | ICD-10-CM | POA: Diagnosis not present

## 2020-06-22 DIAGNOSIS — M25512 Pain in left shoulder: Secondary | ICD-10-CM | POA: Diagnosis not present

## 2020-06-22 DIAGNOSIS — M25612 Stiffness of left shoulder, not elsewhere classified: Secondary | ICD-10-CM | POA: Diagnosis not present

## 2020-06-25 DIAGNOSIS — Z23 Encounter for immunization: Secondary | ICD-10-CM | POA: Diagnosis not present

## 2020-06-26 DIAGNOSIS — M25512 Pain in left shoulder: Secondary | ICD-10-CM | POA: Diagnosis not present

## 2020-06-26 DIAGNOSIS — M25422 Effusion, left elbow: Secondary | ICD-10-CM | POA: Diagnosis not present

## 2020-06-26 DIAGNOSIS — M25312 Other instability, left shoulder: Secondary | ICD-10-CM | POA: Diagnosis not present

## 2020-06-26 DIAGNOSIS — M25612 Stiffness of left shoulder, not elsewhere classified: Secondary | ICD-10-CM | POA: Diagnosis not present

## 2020-06-28 DIAGNOSIS — M25312 Other instability, left shoulder: Secondary | ICD-10-CM | POA: Diagnosis not present

## 2020-06-28 DIAGNOSIS — M25422 Effusion, left elbow: Secondary | ICD-10-CM | POA: Diagnosis not present

## 2020-06-28 DIAGNOSIS — M25612 Stiffness of left shoulder, not elsewhere classified: Secondary | ICD-10-CM | POA: Diagnosis not present

## 2020-06-28 DIAGNOSIS — M25512 Pain in left shoulder: Secondary | ICD-10-CM | POA: Diagnosis not present

## 2020-07-10 ENCOUNTER — Ambulatory Visit
Admission: RE | Admit: 2020-07-10 | Discharge: 2020-07-10 | Disposition: A | Discharge status: Home / Self Care | Payer: Medicare Other | Source: Ambulatory Visit | Attending: Family Medicine | Admitting: Family Medicine

## 2020-07-10 ENCOUNTER — Other Ambulatory Visit: Payer: Self-pay | Admitting: Family Medicine

## 2020-07-10 DIAGNOSIS — R9389 Abnormal findings on diagnostic imaging of other specified body structures: Secondary | ICD-10-CM

## 2020-07-10 DIAGNOSIS — E042 Nontoxic multinodular goiter: Secondary | ICD-10-CM | POA: Diagnosis not present

## 2020-07-10 DIAGNOSIS — R911 Solitary pulmonary nodule: Secondary | ICD-10-CM | POA: Diagnosis not present

## 2020-07-11 DIAGNOSIS — M25312 Other instability, left shoulder: Secondary | ICD-10-CM | POA: Diagnosis not present

## 2020-07-11 DIAGNOSIS — M25512 Pain in left shoulder: Secondary | ICD-10-CM | POA: Diagnosis not present

## 2020-07-11 DIAGNOSIS — M19212 Secondary osteoarthritis, left shoulder: Secondary | ICD-10-CM | POA: Diagnosis not present

## 2020-07-12 ENCOUNTER — Other Ambulatory Visit: Payer: Self-pay | Admitting: Surgery

## 2020-07-12 DIAGNOSIS — E042 Nontoxic multinodular goiter: Secondary | ICD-10-CM

## 2020-07-17 ENCOUNTER — Other Ambulatory Visit: Payer: Self-pay | Admitting: Orthopedic Surgery

## 2020-07-17 DIAGNOSIS — Z01811 Encounter for preprocedural respiratory examination: Secondary | ICD-10-CM

## 2020-07-19 NOTE — Progress Notes (Addendum)
COVID Vaccine Completed: Yes x4 Date COVID Vaccine completed:  03-24-19, 04-14-19 Has received booster:  12-30-19, 06-25-20 COVID vaccine manufacturer: Pfizer      Date of COVID positive in last 90 days:  N/A  PCP - Harlan Stains, MD Cardiologist - N/A  Chest x-ray - 07-11-20 Epic EKG - 07-24-20 Epic Stress Test - N/A ECHO - N/A Cardiac Cath - N/A Pacemaker/ICD device last checked: Spinal Cord Stimulator:  Sleep Study - N/A CPAP -   Fasting Blood Sugar - 116 Checks Blood Sugar - 1  time a day  Blood Thinner Instructions:  N/A Aspirin Instructions: Last Dose:  Activity level:  Able to perform activities of daily living without stopping and without symptoms of chest pain or shortness of breath.    Anesthesia review:  LBBB noted on EKG at PAT  Patient denies shortness of breath, fever, cough and chest pain at PAT appointment   Patient verbalized understanding of instructions that were given to them at the PAT appointment. Patient was also instructed that they will need to review over the PAT instructions again at home before surgery.

## 2020-07-19 NOTE — Patient Instructions (Addendum)
DUE TO COVID-19 ONLY ONE VISITOR IS ALLOWED TO COME WITH YOU AND STAY IN THE WAITING ROOM ONLY DURING PRE OP AND PROCEDURE.   **NO VISITORS ARE ALLOWED IN THE SHORT STAY AREA OR RECOVERY ROOM!!**        Your procedure is scheduled on:  Thursday, 08-02-20   Report to [facility] Main  Entrance   Report to Short Stay at 5:15 AM   [facility])   Call this number if you have problems the morning of surgery [phone]   Do not eat food :After Midnight.   May have liquids until 4:30 AM day of surgery  CLEAR LIQUID DIET  Foods Allowed                                                                     Foods Excluded  Water, Black Coffee and tea, regular and decaf             liquids that you cannot  Plain Jell-O in any flavor  (No red)                                    see through such as: Fruit ices (not with fruit pulp)                                      milk, soups, orange juice              Iced Popsicles (No red)                                      All solid food                                   Apple juices Sports drinks like Gatorade (No red) Lightly seasoned clear broth or consume(fat free) Sugar, honey syrup    Complete one G2 drink the morning of surgery at 4:30 AM  the day of surgery.       1. The day of surgery:  ? Drink ONE (1) Pre-Surgery Clear Ensure or G2 by am the morning of surgery. Drink in one sitting. Do not sip.  ? This drink was given to you during your hospital  pre-op appointment visit. ? Nothing else to drink after completing the  Pre-Surgery Clear Ensure or G2.          If you have questions, please contact your surgeon's office.     Oral Hygiene is also important to reduce your risk of infection.                                    Remember - BRUSH YOUR TEETH THE MORNING OF SURGERY WITH YOUR REGULAR TOOTHPASTE   Do NOT smoke after Midnight   Take these medicines the morning of surgery with A SIP OF WATER:  Amlodipine  DO NOT TAKE  ANY ORAL DIABETIC MEDICATIONS DAY OF YOUR SURGERY  How to Manage Your Diabetes Before and After Surgery  Why is it important to control my blood sugar before and after surgery? . Improving blood sugar levels before and after surgery helps healing and can limit problems. . A way of improving blood sugar control is eating a healthy diet by: o  Eating less sugar and carbohydrates o  Increasing activity/exercise o  Talking with your doctor about reaching your blood sugar goals . High blood sugars (greater than 180 mg/dL) can raise your risk of infections and slow your recovery, so you will need to focus on controlling your diabetes during the weeks before surgery. . Make sure that the doctor who takes care of your diabetes knows about your planned surgery including the date and location.  How do I manage my blood sugar before surgery? . Check your blood sugar at least 4 times a day, starting 2 days before surgery, to make sure that the level is not too high or low. o Check your blood sugar the morning of your surgery when you wake up and every 2 hours until you get to the Short Stay unit. . If your blood sugar is less than 70 mg/dL, you will need to treat for low blood sugar: o Do not take insulin. o Treat a low blood sugar (less than 70 mg/dL) with  cup of clear juice (cranberry or apple), 4 glucose tablets, OR glucose gel. o Recheck blood sugar in 15 minutes after treatment (to make sure it is greater than 70 mg/dL). If your blood sugar is not greater than 70 mg/dL on recheck, call [phone] for further instructions. . Report your blood sugar to the short stay nurse when you get to Short Stay.  . If you are admitted to the hospital after surgery: o Your blood sugar will be checked by the staff and you will probably be given insulin after surgery (instead of oral diabetes medicines) to make sure you have good blood sugar levels. o The goal for blood sugar control after surgery is 80-180  mg/dL.   WHAT DO I DO ABOUT MY DIABETES MEDICATION?  Marland Kitchen Do not take oral diabetes medicines (pills) the morning of surgery.  . THE DAY BEFORE SURGERY:  Take Metformin as prescribed.       . THE MORNING OF SURGERY:  Do not take Metformin.   Reviewed and Endorsed by [facility] Patient Education Committee, August 2015                               You may not have any metal on your body including hair pins, jewelry, and body piercings             Do not wear make-up, lotions, powders, perfumes/cologne, or deodorant             Do not wear nail polish.  Do not shave  48 hours prior to surgery.               Do not bring valuables to the hospital. [place].   Contacts, dentures or bridgework may not be worn into surgery.   Patients discharged the day of surgery will not be allowed to drive home.   Special Instructions: Bring a copy of your healthcare power of attorney and living will documents  the day of surgery if you haven't  scanned them in before.              Please read over the following fact sheets you were given: IF YOU HAVE QUESTIONS ABOUT YOUR PRE OP INSTRUCTIONS PLEASE CALL  [place]- Preparing for Total Shoulder Arthroplasty    Before surgery, you can play an important role. Because skin is not sterile, your skin needs to be as free of germs as possible. You can reduce the number of germs on your skin by using the following products. . Benzoyl Peroxide Gel o Reduces the number of germs present on the skin o Applied twice a day to shoulder area starting two days before surgery    ==================================================================  Please follow these instructions carefully:  BENZOYL PEROXIDE 5% GEL  Please do not use if you have an allergy to benzoyl peroxide.   If your skin becomes reddened/irritated stop using the benzoyl peroxide.  Starting two days before surgery, apply as  follows: 1. Apply benzoyl peroxide in the morning and at night. Apply after taking a shower. If you are not taking a shower clean entire shoulder front, back, and side along with the armpit with a clean wet washcloth.  2. Place a quarter-sized dollop on your shoulder and rub in thoroughly, making sure to cover the front, back, and side of your shoulder, along with the armpit.   2 days before ____ AM   ____ PM              1 day before ____ AM   ____ PM                         3. Do this twice a day for two days.  (Last application is the night before surgery, AFTER using the CHG soap as described below).  4. Do NOT apply benzoyl peroxide gel on the day of surgery.  [place] - Preparing for Surgery Before surgery, you can play an important role.  Because skin is not sterile, your skin needs to be as free of germs as possible.  You can reduce the number of germs on your skin by washing with CHG (chlorahexidine gluconate) soap before surgery.  CHG is an antiseptic cleaner which kills germs and bonds with the skin to continue killing germs even after washing. Please DO NOT use if you have an allergy to CHG or antibacterial soaps.  If your skin becomes reddened/irritated stop using the CHG and inform your nurse when you arrive at Short Stay. Do not shave (including legs and underarms) for at least 48 hours prior to the first CHG shower.  You may shave your face/neck.  Please follow these instructions carefully:  1.  Shower with CHG Soap the night before surgery and the  morning of surgery.  2.  If you choose to wash your hair, wash your hair first as usual with your normal  shampoo.  3.  After you shampoo, rinse your hair and body thoroughly to remove the shampoo.                             4.  Use CHG as you would any other liquid soap.  You can apply chg directly to the skin and wash.  Gently with a scrungie or clean washcloth.  5.  Apply the CHG Soap to your body ONLY  FROM THE NECK DOWN.   Do    not use on face/ open                           Wound or open sores. Avoid contact with eyes, ears mouth and   genitals (private parts).                       Wash face,  Genitals (private parts) with your normal soap.             6.  Wash thoroughly, paying special attention to the area where your    surgery  will be performed.  7.  Thoroughly rinse your body with warm water from the neck down.  8.  DO NOT shower/wash with your normal soap after using and rinsing off the CHG Soap.                9.  Pat yourself dry with a clean towel.            10.  Wear clean pajamas.            11.  Place clean sheets on your bed the night of your first shower and do not  sleep with pets. Day of Surgery : Do not apply any lotions/deodorants the morning of surgery.  Please wear clean clothes to the hospital/surgery center.  FAILURE TO FOLLOW THESE INSTRUCTIONS MAY RESULT IN THE CANCELLATION OF YOUR SURGERY  PATIENT SIGNATURE_________________________________  NURSE SIGNATURE__________________________________  ________________________________________________________________________   Adam Phenix  An incentive spirometer is a tool that can help keep your lungs clear and active. This tool measures how well you are filling your lungs with each breath. Taking long deep breaths may help reverse or decrease the chance of developing breathing (pulmonary) problems (especially infection) following:  A long period of time when you are unable to move or be active. BEFORE THE PROCEDURE   If the spirometer includes an indicator to show your best effort, your nurse or respiratory therapist will set it to a desired goal.  If possible, sit up straight or lean slightly forward. Try not to slouch.  Hold the incentive spirometer in an upright position. INSTRUCTIONS FOR USE  1. Sit on the edge of your bed if possible, or sit up as far as you can in bed or on a chair. 2. Hold the incentive spirometer in an  upright position. 3. Breathe out normally. 4. Place the mouthpiece in your mouth and seal your lips tightly around it. 5. Breathe in slowly and as deeply as possible, raising the piston or the ball toward the top of the column. 6. Hold your breath for 3-5 seconds or for as long as possible. Allow the piston or ball to fall to the bottom of the column. 7. Remove the mouthpiece from your mouth and breathe out normally. 8. Rest for a few seconds and repeat Steps 1 through 7 at least 10 times every 1-2 hours when you are awake. Take your time and take a few normal breaths between deep breaths. 9. The spirometer may include an indicator to show your best effort. Use the indicator as a goal to work toward during each repetition. 10. After each set of 10 deep breaths, practice coughing to be sure your lungs are clear. If you have an incision (the cut made at the time of surgery), support your incision when coughing by placing a pillow or  rolled up towels firmly against it. Once you are able to get out of bed, walk around indoors and cough well. You may stop using the incentive spirometer when instructed by your caregiver.  RISKS AND COMPLICATIONS  Take your time so you do not get dizzy or light-headed.  If you are in pain, you may need to take or ask for pain medication before doing incentive spirometry. It is harder to take a deep breath if you are having pain. AFTER USE  Rest and breathe slowly and easily.  It can be helpful to keep track of a log of your progress. Your caregiver can provide you with a simple table to help with this. If you are using the spirometer at home, follow these instructions: [place] IF:   You are having difficultly using the spirometer.  You have trouble using the spirometer as often as instructed.  Your pain medication is not giving enough relief while using the spirometer.  You develop fever of 100.5 F (38.1 C) or higher. SEEK IMMEDIATE MEDICAL CARE  IF:   You cough up bloody sputum that had not been present before.  You develop fever of 102 F (38.9 C) or greater.  You develop worsening pain at or near the incision site. MAKE SURE YOU:   Understand these instructions.  Will watch your condition.  Will get help right away if you are not doing well or get worse. Document Released: 06/30/2006 Document Revised: 05/12/2011 Document Reviewed: 08/31/2006 ExitCare Patient Information 2014 ExitCare, [place].   ________________________________________________________________________  WHAT IS A BLOOD TRANSFUSION? Blood Transfusion Information  A transfusion is the replacement of blood or some of its parts. Blood is made up of multiple cells which provide different functions.  Red blood cells carry oxygen and are used for blood loss replacement.  White blood cells fight against infection.  Platelets control bleeding.  Plasma helps clot blood.  Other blood products are available for specialized needs, such as hemophilia or other clotting disorders. BEFORE THE TRANSFUSION  Who gives blood for transfusions?   Healthy volunteers who are fully evaluated to make sure their blood is safe. This is blood bank blood. Transfusion therapy is the safest it has ever been in the practice of medicine. Before blood is taken from a donor, a complete history is taken to make sure that person has no history of diseases nor engages in risky social behavior (examples are intravenous drug use or sexual activity with multiple partners). The donor's travel history is screened to minimize risk of transmitting infections, such as malaria. The donated blood is tested for signs of infectious diseases, such as HIV and hepatitis. The blood is then tested to be sure it is compatible with you in order to minimize the chance of a transfusion reaction. If you or a relative donates blood, this is often done in anticipation of surgery and is not appropriate for emergency  situations. It takes many days to process the donated blood. RISKS AND COMPLICATIONS Although transfusion therapy is very safe and saves many lives, the main dangers of transfusion include:   Getting an infectious disease.  Developing a transfusion reaction. This is an allergic reaction to something in the blood you were given. Every precaution is taken to prevent this. The decision to have a blood transfusion has been considered carefully by your caregiver before blood is given. Blood is not given unless the benefits outweigh the risks. AFTER THE TRANSFUSION  Right after receiving a blood transfusion, you will usually  feel much better and more energetic. This is especially true if your red blood cells have gotten low (anemic). The transfusion raises the level of the red blood cells which carry oxygen, and this usually causes an energy increase.  The nurse administering the transfusion will monitor you carefully for complications. HOME CARE INSTRUCTIONS  No special instructions are needed after a transfusion. You may find your energy is better. Speak with your caregiver about any limitations on activity for underlying diseases you may have. SEEK MEDICAL CARE IF:   Your condition is not improving after your transfusion.  You develop redness or irritation at the intravenous (IV) site. SEEK IMMEDIATE MEDICAL CARE IF:  Any of the following symptoms occur over the next 12 hours:  Shaking chills.  You have a temperature by mouth above 102 F (38.9 C), not controlled by medicine.  Chest, back, or muscle pain.  People around you feel you are not acting correctly or are confused.  Shortness of breath or difficulty breathing.  Dizziness and fainting.  You get a rash or develop hives.  You have a decrease in urine output.  Your urine turns a dark color or changes to pink, red, or brown. Any of the following symptoms occur over the next 10 days:  You have a temperature by mouth above  102 F (38.9 C), not controlled by medicine.  Shortness of breath.  Weakness after normal activity.  The white part of the eye turns yellow (jaundice).  You have a decrease in the amount of urine or are urinating less often.  Your urine turns a dark color or changes to pink, red, or brown. Document Released: 02/15/2000 Document Revised: 05/12/2011 Document Reviewed: 10/04/2007 [facility] Patient Information 2014 [place], [place].  _______________________________________________________________________

## 2020-07-24 ENCOUNTER — Encounter (HOSPITAL_COMMUNITY)
Admission: RE | Admit: 2020-07-24 | Discharge: 2020-07-24 | Disposition: A | Discharge status: Home / Self Care | Payer: Medicare Other | Source: Ambulatory Visit | Attending: Orthopedic Surgery | Admitting: Orthopedic Surgery

## 2020-07-24 ENCOUNTER — Other Ambulatory Visit: Payer: Self-pay

## 2020-07-24 ENCOUNTER — Encounter (HOSPITAL_COMMUNITY): Payer: Self-pay

## 2020-07-24 DIAGNOSIS — Z01818 Encounter for other preprocedural examination: Secondary | ICD-10-CM | POA: Diagnosis not present

## 2020-07-24 HISTORY — DX: Unspecified malignant neoplasm of skin, unspecified: C44.90

## 2020-07-24 HISTORY — DX: Nontoxic multinodular goiter: E04.2

## 2020-07-24 LAB — COMPREHENSIVE METABOLIC PANEL
ALT: 12 U/L (ref 0–44)
AST: 19 U/L (ref 15–41)
Albumin: 4.4 g/dL (ref 3.5–5.0)
Alkaline Phosphatase: 65 U/L (ref 38–126)
Anion gap: 8 (ref 5–15)
BUN: 24 mg/dL — ABNORMAL HIGH (ref 8–23)
CO2: 27 mmol/L (ref 22–32)
Calcium: 9.8 mg/dL (ref 8.9–10.3)
Chloride: 105 mmol/L (ref 98–111)
Creatinine, Ser: 0.66 mg/dL (ref 0.44–1.00)
GFR, Estimated: >60 mL/min (ref >60)
Glucose, Bld: 116 mg/dL — ABNORMAL HIGH (ref 70–99)
Potassium: 4.3 mmol/L (ref 3.5–5.1)
Sodium: 140 mmol/L (ref 135–145)
Total Bilirubin: 0.8 mg/dL (ref 0.3–1.2)
Total Protein: 7.3 g/dL (ref 6.5–8.1)

## 2020-07-24 LAB — CBC WITH DIFFERENTIAL/PLATELET
Abs Immature Granulocytes: 0.02 10*3/uL (ref 0.00–0.07)
Basophils Absolute: 0 10*3/uL (ref 0.0–0.1)
Basophils Relative: 0 %
Eosinophils Absolute: 0.1 10*3/uL (ref 0.0–0.5)
Eosinophils Relative: 2 %
HCT: 40.1 % (ref 36.0–46.0)
Hemoglobin: 12.9 g/dL (ref 12.0–15.0)
Immature Granulocytes: 0 %
Lymphocytes Relative: 32 %
Lymphs Abs: 2.3 10*3/uL (ref 0.7–4.0)
MCH: 29.6 pg (ref 26.0–34.0)
MCHC: 32.2 g/dL (ref 30.0–36.0)
MCV: 92 fL (ref 80.0–100.0)
Monocytes Absolute: 0.7 10*3/uL (ref 0.1–1.0)
Monocytes Relative: 10 %
Neutro Abs: 4.1 10*3/uL (ref 1.7–7.7)
Neutrophils Relative %: 56 %
Platelets: 271 10*3/uL (ref 150–400)
RBC: 4.36 MIL/uL (ref 3.87–5.11)
RDW: 12.2 % (ref 11.5–15.5)
WBC: 7.2 10*3/uL (ref 4.0–10.5)
nRBC: 0 % (ref 0.0–0.2)

## 2020-07-24 LAB — SURGICAL PCR SCREEN
MRSA, PCR: NEGATIVE
Staphylococcus aureus: NEGATIVE

## 2020-07-24 LAB — URINALYSIS, ROUTINE W REFLEX MICROSCOPIC
Bilirubin Urine: NEGATIVE
Glucose, UA: NEGATIVE mg/dL
Hgb urine dipstick: NEGATIVE
Ketones, ur: NEGATIVE mg/dL
Nitrite: NEGATIVE
Protein, ur: NEGATIVE mg/dL
Specific Gravity, Urine: 1.014 (ref 1.005–1.030)
pH: 6 (ref 5.0–8.0)

## 2020-07-24 LAB — GLUCOSE, CAPILLARY: Glucose-Capillary: 129 mg/dL — ABNORMAL HIGH (ref 70–99)

## 2020-07-24 LAB — APTT: aPTT: 31 seconds (ref 24–36)

## 2020-07-25 ENCOUNTER — Encounter (HOSPITAL_COMMUNITY): Payer: Self-pay | Admitting: Physician Assistant

## 2020-07-25 LAB — HEMOGLOBIN A1C
Hgb A1c MFr Bld: 6.5 % — ABNORMAL HIGH (ref 4.8–5.6)
Mean Plasma Glucose: 140 mg/dL

## 2020-07-27 ENCOUNTER — Ambulatory Visit
Admission: RE | Admit: 2020-07-27 | Discharge: 2020-07-27 | Disposition: A | Discharge status: Home / Self Care | Payer: Medicare Other | Source: Ambulatory Visit | Attending: Surgery | Admitting: Surgery

## 2020-07-27 DIAGNOSIS — E042 Nontoxic multinodular goiter: Secondary | ICD-10-CM | POA: Diagnosis not present

## 2020-08-02 ENCOUNTER — Encounter (HOSPITAL_COMMUNITY): Admission: RE | Payer: Self-pay | Source: Other Acute Inpatient Hospital

## 2020-08-02 ENCOUNTER — Ambulatory Visit (HOSPITAL_COMMUNITY)
Admission: RE | Admit: 2020-08-02 | Payer: Medicare Other | Source: Other Acute Inpatient Hospital | Admitting: Orthopedic Surgery

## 2020-08-02 LAB — TYPE AND SCREEN
ABO/RH(D): O POS
Antibody Screen: NEGATIVE

## 2020-08-02 SURGERY — ARTHROPLASTY, SHOULDER, TOTAL, REVERSE
Anesthesia: Choice | Site: Shoulder | Laterality: Left

## 2020-08-10 ENCOUNTER — Encounter: Payer: Self-pay | Admitting: *Deleted

## 2020-08-10 ENCOUNTER — Ambulatory Visit (INDEPENDENT_AMBULATORY_CARE_PROVIDER_SITE_OTHER): Payer: Medicare Other | Admitting: Internal Medicine

## 2020-08-10 ENCOUNTER — Other Ambulatory Visit: Payer: Self-pay

## 2020-08-10 VITALS — BP 126/54 | HR 60 | Wt 109.6 lb

## 2020-08-10 DIAGNOSIS — I447 Left bundle-branch block, unspecified: Secondary | ICD-10-CM

## 2020-08-10 NOTE — Progress Notes (Signed)
 Cardiology Office Note   Date:  08/10/2020   ID:  Sherry Oconnor, DOB 05/21/1931, MRN [id number]  PCP:  White, Cynthia, MD  Cardiologist:   Paula Ross, MD   Pt referred fro preop risk stratification       History of Present Illness: Sherry Oconnor is a 88 y.o. female with a history of  HTN, DM, Hyperlipidemai,Goiter, breat CA She was seen in preop clinic   EKG showed   SR with first degree AV block .   LVH with significant IVCD  QRS 140 msec.  Talking the patient she lives at home with husband   Does some house work but not too activie   Denies CP   Breathing is OK   Has occsaional dizziness with quick standing          Current Meds  Medication Sig   amLODipine (NORVASC) 2.5 MG tablet Take 2.5 mg by mouth daily.   calcium carbonate (TUMS - DOSED IN MG ELEMENTAL CALCIUM) 500 MG chewable tablet Chew 500 mg by mouth daily as needed for indigestion or heartburn.   fluticasone (FLONASE) 50 MCG/ACT nasal spray Place 2 sprays into the nose 2 (two) times daily as needed for allergies.    ibuprofen (ADVIL) 200 MG tablet Take 400 mg by mouth every 8 (eight) hours as needed for headache or moderate pain.   Ketotifen Fumarate (ALLERGY EYE DROPS OP) Place 1 drop into both eyes daily as needed (allergies).   lisinopril (ZESTRIL) 10 MG tablet Take 10 mg by mouth daily.   metFORMIN (GLUCOPHAGE-XR) 750 MG 24 hr tablet Take 1,500 mg by mouth daily with supper.    pravastatin (PRAVACHOL) 80 MG tablet Take 80 mg by mouth at bedtime.    vitamin B-12 (CYANOCOBALAMIN) 1000 MCG tablet Take 1,000 mcg by mouth daily.   Vitamin D, Ergocalciferol, (DRISDOL) 1.25 MG (50000 UT) CAPS capsule Take 50,000 Units by mouth every Saturday.     Allergies:   Darvon, Septra [bactrim], Sulfa drugs cross reactors, Zithromax [azithromycin], Codeine, Latex, Naproxen, and Tessalon perles [benzonatate]   Past Medical History:  Diagnosis Date   Allergies    Arthritis    BRCA2 positive 07/17/2017   Pathogenic BRCA2  mutation [id number] ([id number]) @ [facility]   Carotid bruit    Right (Doppler done in 2/19   Diabetes mellitus    Diverticulosis    Family history of BRCA2 gene positive    BRCA2 mutation in daughter   Genetic testing 07/17/2017   BRCA2 analysis @ [facility] - Familial pathogenic BRCA2 mutation detected   Hoarseness of voice    Chronic   Hypercholesteremia    Hypertension    Multiple thyroid nodules    Skin cancer    Head   Spasmodic dysphonia    multinodular goiter   Vitamin B12 deficiency    Vitamin D deficiency     Past Surgical History:  Procedure Laterality Date   ABDOMINAL HYSTERECTOMY     BIOPSY THYROID     CHOLECYSTECTOMY     COLONOSCOPY     excision of skin cancer     Head   GALLBLADDER SURGERY     HERNIA REPAIR Right    LIPOMA EXCISION     OOPHORECTOMY     VENTRAL HERNIA REPAIR Right 01/20/2019   Procedure: LAPAROSCOPIC RIGHT SPIGELAIN HERNIA REPAIR WITH MESH;  Surgeon: Martin, Matthew, MD;  Location: [facility];  Service: General;  Laterality: Right;     Social History:  The patient  reports that she   has never smoked. She has never used smokeless tobacco. She reports that she does not drink alcohol and does not use drugs.   Family History:  The patient's family history includes Breast cancer (age of onset: 50) in her daughter; Breast cancer (age of onset: 45) in her daughter; CAD in her brother; Colon cancer in her brother; Coronary artery disease in her mother; Deep vein thrombosis in her maternal grandmother; Heart attack in her mother; Heart disease in her mother; Hypertension in her brother; Pancreatic cancer in her daughter; Peripheral Artery Disease in her brother; Pneumonia in her father.    ROS:  Please see the history of present illness. All other systems are reviewed and  Negative to the above problem except as noted.    PHYSICAL EXAM: VS:  BP (!) 126/54   Pulse 60   Wt 109 lb 9.6 oz (49.7 kg)   SpO2 98%   BMI 21.40 kg/m   GEN:   Thin 87  yo in no acute distress  HEENT: normal  Neck: no JVD, carotid bruits Cardiac: RRR; no murmurs  No LE  edema  Respiratory:  clear to auscultation bilaterally,  GI: soft, nontender, nondistended, + BS  No hepatomegaly  MS: no deformity Moving all extremities   Skin: warm and dry, no rash Neuro:  Strength and sensation are intact Psych: euthymic mood, full affect   EKG:  EKG is not ordered today.   Lipid Panel No results found for: CHOL, TRIG, HDL, CHOLHDL, VLDL, LDLCALC, LDLDIRECT    Wt Readings from Last 3 Encounters:  08/10/20 109 lb 9.6 oz (49.7 kg)  07/24/20 109 lb 3.2 oz (49.5 kg)  01/20/19 117 lb 8 oz (53.3 kg)      ASSESSMENT AND PLAN:  1  Abnormal EKG   EKG with SR and significant conduction delay with first degree AV block and IVCD (not LBBB)   EKG is nondiagnostic   Given this aslong with Hx DM and lmited activity I would recomm a lexiscan myovue to r/o large area f ischemia     2   HTN  BP is OK  FOllow     3  Hl   On pravastatin  LDL 79  HDL 80   Continue     Current medicines are reviewed at length with the patient today.  The patient does not have concerns regarding medicines.  Signed, Dorris Carnes, MD  08/10/2020 2:26 PM    [address], [place], [place]  [address] Phone: [phone]; Fax: [phone]

## 2020-08-10 NOTE — Patient Instructions (Signed)
Medication Instructions:  No changes *If you need a refill on your cardiac medications before your next appointment, please call your pharmacy*   Lab Work: none   Testing/Procedures: Your physician has requested that you have a lexiscan myoview. For further information please visit [email or web address]. Please follow instruction sheet, as given.   Follow-Up: As needed   Other Instructions

## 2020-08-13 ENCOUNTER — Telehealth (HOSPITAL_COMMUNITY): Payer: Self-pay | Admitting: *Deleted

## 2020-08-13 NOTE — Telephone Encounter (Signed)
Patient given detailed instructions per Myocardial Perfusion Study Information Sheet for the test on 08/15/2020 at 10:30. Patient notified to arrive 15 minutes early and that it is imperative to arrive on time for appointment to keep from having the test rescheduled.  If you need to cancel or reschedule your appointment, please call the office within 24 hours of your appointment. . Patient verbalized understanding.Sherry Oconnor

## 2020-08-15 ENCOUNTER — Other Ambulatory Visit: Payer: Self-pay

## 2020-08-15 ENCOUNTER — Ambulatory Visit (HOSPITAL_COMMUNITY): Payer: Medicare Other | Attending: Cardiovascular Disease

## 2020-08-15 DIAGNOSIS — I447 Left bundle-branch block, unspecified: Secondary | ICD-10-CM | POA: Diagnosis not present

## 2020-08-15 LAB — MYOCARDIAL PERFUSION IMAGING
LV dias vol: 67 mL (ref 46–106)
LV sys vol: 16 mL
Peak HR: 101 {beats}/min
Rest HR: 66 {beats}/min
SDS: 1
SRS: 3
SSS: 4
TID: 0.84

## 2020-08-15 MED ORDER — TECHNETIUM TC 99M TETROFOSMIN IV KIT
10.1000 | PACK | Freq: Once | INTRAVENOUS | Status: AC | PRN
  Administered 2020-08-15: 10.1 via INTRAVENOUS
  Filled 2020-08-15: qty 11

## 2020-08-15 MED ORDER — TECHNETIUM TC 99M TETROFOSMIN IV KIT
29.3000 | PACK | Freq: Once | INTRAVENOUS | Status: AC | PRN
  Administered 2020-08-15: 29.3 via INTRAVENOUS
  Filled 2020-08-15: qty 30

## 2020-08-15 MED ORDER — REGADENOSON 0.4 MG/5ML IV SOLN
0.4000 mg | Freq: Once | INTRAVENOUS | Status: AC
  Administered 2020-08-15: 0.4 mg via INTRAVENOUS

## 2020-08-24 ENCOUNTER — Other Ambulatory Visit: Payer: Self-pay | Admitting: Orthopedic Surgery

## 2020-08-24 DIAGNOSIS — Z01811 Encounter for preprocedural respiratory examination: Secondary | ICD-10-CM

## 2020-08-27 ENCOUNTER — Other Ambulatory Visit: Payer: Self-pay | Admitting: Orthopedic Surgery

## 2020-08-27 ENCOUNTER — Telehealth: Payer: Self-pay | Admitting: Internal Medicine

## 2020-08-27 NOTE — Telephone Encounter (Signed)
   Name: Sherry Oconnor  DOB: [birth year]  MRN: [id number]   Primary Cardiologist: Dorris Carnes, MD  Chart reviewed as part of pre-operative protocol coverage.   Per Dr. Harrington Challenger: Myovue is normal   No ischemia or scar Pumping function heart is normal  WIl forward to surgeon   Low risk   OK to proceed from cardiac standlpoint.  I will route this recommendation to the requesting party via Epic fax function and remove from pre-op pool. Please call with questions.  Ledora Bottcher, PA 08/27/2020, 3:44 PM

## 2020-08-27 NOTE — Telephone Encounter (Signed)
   [facility] Pre-operative Risk Assessment    Patient Name: Sherry Oconnor  DOB: [birth year]  MRN: [id number]   HEARTCARE STAFF: - Please ensure there is not already an duplicate clearance open for this procedure. - Under Visit Info/Reason for Call, type in Other and utilize the format Clearance MM/DD/YY or Clearance TBD. Do not use dashes or single digits. - If request is for dental extraction, please clarify the # of teeth to be extracted. - If the patient is currently at the dentist's office, call Pre-Op APP to address. If the patient is not currently in the dentist office, please route to the Pre-Op pool  Request for surgical clearance:  What type of surgery is being performed? Left shoulder reverse total Orthoplasty   When is this surgery scheduled? 09-13-20  What type of clearance is required (medical clearance vs. Pharmacy clearance to hold med vs. Both)? Both, also need copy of pt's Stress Test  Are there any medications that need to be held prior to surgery and how long?   Practice name and name of physician performing surgery? Dr Tania Oconnor   What is the office phone number? [phone]   7.   What is the office fax number? [phone]  8.   Anesthesia type (None, local, MAC, general) ? Choice   Sherry Oconnor 08/27/2020, 11:44 AM  _________________________________________________________________   (provider comments below)

## 2020-08-31 NOTE — Progress Notes (Addendum)
COVID Vaccine Completed: yes x4 Date COVID Vaccine completed: 03/24/19, 04/14/19 Has received booster: 12/30/19, 06/25/20 COVID vaccine manufacturer: [employer]      Date of COVID positive in last 90 days: N/A  PCP - Harlan Stains, MD Cardiologist - Dorris Carnes  Chest x-ray - 07/11/20 Epic EKG - 07/24/20 Epic Stress Test - 08/15/20 Epic ECHO - N/A Cardiac Cath - N/A Pacemaker/ICD device last checked: N/A Spinal Cord Stimulator:N/A  Cardiac Clearance  note 08/27/20 by Fabian Sharp  Sleep Study - N/A CPAP -   Fasting Blood Sugar - 110s Checks Blood Sugar 1 times a day  Blood Thinner Instructions: N/A Aspirin Instructions: Last Dose:  Activity level: Can go up a flight of stairs and perform activities of daily living without stopping and without symptoms of chest pain or shortness of breath.    Anesthesia review: HTN, DM, 1st degree AV block on EKG  Patient denies shortness of breath, fever, cough and chest pain at PAT appointment   Patient verbalized understanding of instructions that were given to them at the PAT appointment. Patient was also instructed that they will need to review over the PAT instructions again at home before surgery.

## 2020-08-31 NOTE — Patient Instructions (Addendum)
DUE TO COVID-19 ONLY ONE VISITOR IS ALLOWED TO COME WITH YOU AND STAY IN THE WAITING ROOM ONLY DURING PRE OP AND PROCEDURE.   **NO VISITORS ARE ALLOWED IN THE SHORT STAY AREA OR RECOVERY ROOM!!**        Your procedure is scheduled on: 09/13/20   Report to [facility] Main  Entrance    Report to admitting at 7:45 AM   Call this number if you have problems the morning of surgery [phone]   Do not eat food :After Midnight.   May have liquids until 7:15 AM day of surgery  CLEAR LIQUID DIET  Foods Allowed                                                                     Foods Excluded  Water, Black Coffee and tea, regular and decaf               liquids that you cannot  Plain Jell-O in any flavor  (No red)                                     see through such as: Fruit ices (not with fruit pulp)                                             milk, soups, orange juice              Iced Popsicles (No red)                                                 All solid food                                   Apple juices Sports drinks like Gatorade (No red) Lightly seasoned clear broth or consume(fat free) Sugar, honey syrup      The day of surgery:  Drink ONE (1) Pre-Surgery Clear G2 by 7:15 am the morning of surgery. Drink in one sitting. Do not sip.  This drink was given to you during your hospital  pre-op appointment visit. Nothing else to drink after completing the  Pre-Surgery Clear G2.          If you have questions, please contact your surgeon's office.     Oral Hygiene is also important to reduce your risk of infection.                                    Remember - BRUSH YOUR TEETH THE MORNING OF SURGERY WITH YOUR REGULAR TOOTHPASTE   Take these medicines the morning of surgery with A SIP OF WATER: Amlodipine  DO NOT TAKE ANY ORAL DIABETIC MEDICATIONS DAY OF YOUR SURGERY  How to Manage Your Diabetes Before  and After Surgery  Why is it important to control my  blood sugar before and after surgery? Improving blood sugar levels before and after surgery helps healing and can limit problems. A way of improving blood sugar control is eating a healthy diet by:  Eating less sugar and carbohydrates  Increasing activity/exercise  Talking with your doctor about reaching your blood sugar goals High blood sugars (greater than 180 mg/dL) can raise your risk of infections and slow your recovery, so you will need to focus on controlling your diabetes during the weeks before surgery. Make sure that the doctor who takes care of your diabetes knows about your planned surgery including the date and location.  How do I manage my blood sugar before surgery? Check your blood sugar at least 4 times a day, starting 2 days before surgery, to make sure that the level is not too high or low. Check your blood sugar the morning of your surgery when you wake up and every 2 hours until you get to the Short Stay unit. If your blood sugar is less than 70 mg/dL, you will need to treat for low blood sugar: Do not take insulin. Treat a low blood sugar (less than 70 mg/dL) with  cup of clear juice (cranberry or apple), 4 glucose tablets, OR glucose gel. Recheck blood sugar in 15 minutes after treatment (to make sure it is greater than 70 mg/dL). If your blood sugar is not greater than 70 mg/dL on recheck, call [phone] for further instructions. Report your blood sugar to the short stay nurse when you get to Short Stay.  If you are admitted to the hospital after surgery: Your blood sugar will be checked by the staff and you will probably be given insulin after surgery (instead of oral diabetes medicines) to make sure you have good blood sugar levels. The goal for blood sugar control after surgery is 80-180 mg/dL.   WHAT DO I DO ABOUT MY DIABETES MEDICATION?  Do not take oral diabetes medicines (pills) the morning of surgery.  THE DAY BEFORE SURGERY, take Metformin as  prescribed.      THE MORNING OF SURGERY, do not take Metformin.    Reviewed and Endorsed by [facility] Patient Education Committee, August 2015                               You may not have any metal on your body including hair pins, jewelry, and body piercing             Do not wear make-up, lotions, powders, perfumes, or deodorant  Do not wear nail polish including gel and S&S, artificial/acrylic nails, or any other type of covering on natural nails including finger and toenails. If you have artificial nails, gel coating, etc. that needs to be removed by a nail salon please have this removed prior to surgery or surgery may need to be canceled/ delayed if the surgeon/ anesthesia feels like they are unable to be safely monitored.   Do not shave  48 hours prior to surgery.    Do not bring valuables to the hospital. [place].     Patients discharged the day of surgery will not be allowed to drive home.  Special Instructions: Bring a copy of your healthcare power of attorney and living will documents  the day of surgery if you haven't scanned them in before.              Please read over the following fact sheets you were given: IF YOU HAVE QUESTIONS ABOUT YOUR PRE OP INSTRUCTIONS PLEASE CALL [phone]   [facility] - Preparing for Surgery Before surgery, you can play an important role.  Because skin is not sterile, your skin needs to be as free of germs as possible.  You can reduce the number of germs on your skin by washing with CHG (chlorahexidine gluconate) soap before surgery.  CHG is an antiseptic cleaner which kills germs and bonds with the skin to continue killing germs even after washing. Please DO NOT use if you have an allergy to CHG or antibacterial soaps.  If your skin becomes reddened/irritated stop using the CHG and inform your nurse when you arrive at Short Stay. Do not shave (including legs and underarms) for at  least 48 hours prior to the first CHG shower.  You may shave your face/neck.  Please follow these instructions carefully:  1.  Shower with CHG Soap the night before surgery and the  morning of surgery.  2.  If you choose to wash your hair, wash your hair first as usual with your normal  shampoo.  3.  After you shampoo, rinse your hair and body thoroughly to remove the shampoo.                             4.  Use CHG as you would any other liquid soap.  You can apply chg directly to the skin and wash.  Gently with a scrungie or clean washcloth.  5.  Apply the CHG Soap to your body ONLY FROM THE NECK DOWN.   Do   not use on face/ open                           Wound or open sores. Avoid contact with eyes, ears mouth and   genitals (private parts).                       Wash face,  Genitals (private parts) with your normal soap.             6.  Wash thoroughly, paying special attention to the area where your    surgery  will be performed.  7.  Thoroughly rinse your body with warm water from the neck down.  8.  DO NOT shower/wash with your normal soap after using and rinsing off the CHG Soap.                9.  Pat yourself dry with a clean towel.            10.  Wear clean pajamas.            11.  Place clean sheets on your bed the night of your first shower and do not  sleep with pets. Day of Surgery : Do not apply any lotions/deodorants the morning of surgery.  Please wear clean clothes to the hospital/surgery center.  FAILURE TO FOLLOW THESE INSTRUCTIONS MAY RESULT IN THE CANCELLATION OF YOUR SURGERY  PATIENT SIGNATURE_________________________________  NURSE SIGNATURE__________________________________  ________________________________________________________________________   Adam Phenix  An incentive spirometer is a tool that can help keep your lungs clear and active. This tool measures how  well you are filling your lungs with each breath. Taking long deep breaths may help  reverse or decrease the chance of developing breathing (pulmonary) problems (especially infection) following: A long period of time when you are unable to move or be active. BEFORE THE PROCEDURE  If the spirometer includes an indicator to show your best effort, your nurse or respiratory therapist will set it to a desired goal. If possible, sit up straight or lean slightly forward. Try not to slouch. Hold the incentive spirometer in an upright position. INSTRUCTIONS FOR USE  Sit on the edge of your bed if possible, or sit up as far as you can in bed or on a chair. Hold the incentive spirometer in an upright position. Breathe out normally. Place the mouthpiece in your mouth and seal your lips tightly around it. Breathe in slowly and as deeply as possible, raising the piston or the ball toward the top of the column. Hold your breath for 3-5 seconds or for as long as possible. Allow the piston or ball to fall to the bottom of the column. Remove the mouthpiece from your mouth and breathe out normally. Rest for a few seconds and repeat Steps 1 through 7 at least 10 times every 1-2 hours when you are awake. Take your time and take a few normal breaths between deep breaths. The spirometer may include an indicator to show your best effort. Use the indicator as a goal to work toward during each repetition. After each set of 10 deep breaths, practice coughing to be sure your lungs are clear. If you have an incision (the cut made at the time of surgery), support your incision when coughing by placing a pillow or rolled up towels firmly against it. Once you are able to get out of bed, walk around indoors and cough well. You may stop using the incentive spirometer when instructed by your caregiver.  RISKS AND COMPLICATIONS Take your time so you do not get dizzy or light-headed. If you are in pain, you may need to take or ask for pain medication before doing incentive spirometry. It is harder to take a deep  breath if you are having pain. AFTER USE Rest and breathe slowly and easily. It can be helpful to keep track of a log of your progress. Your caregiver can provide you with a simple table to help with this. If you are using the spirometer at home, follow these instructions: [place] IF:  You are having difficultly using the spirometer. You have trouble using the spirometer as often as instructed. Your pain medication is not giving enough relief while using the spirometer. You develop fever of 100.5 F (38.1 C) or higher. SEEK IMMEDIATE MEDICAL CARE IF:  You cough up bloody sputum that had not been present before. You develop fever of 102 F (38.9 C) or greater. You develop worsening pain at or near the incision site. MAKE SURE YOU:  Understand these instructions. Will watch your condition. Will get help right away if you are not doing well or get worse. Document Released: 06/30/2006 Document Revised: 05/12/2011 Document Reviewed: 08/31/2006 [employer] Patient Information 2014 [place].   ________________________________________________________________________ [facility]- Preparing for Total Shoulder Arthroplasty    Before surgery, you can play an important role. Because skin is not sterile, your skin needs to be as free of germs as possible. You can reduce the number of germs on your skin by using the following products. Benzoyl Peroxide Gel Reduces the number of germs  present on the skin Applied twice a day to shoulder area starting two days before surgery    ==================================================================  Please follow these instructions carefully:  BENZOYL PEROXIDE 5% GEL  Please do not use if you have an allergy to benzoyl peroxide.   If your skin becomes reddened/irritated stop using the benzoyl peroxide.  Starting two days before surgery, apply as follows: Apply benzoyl peroxide in the morning and at night. Apply after taking a shower.  If you are not taking a shower clean entire shoulder front, back, and side along with the armpit with a clean wet washcloth.  Place a quarter-sized dollop on your shoulder and rub in thoroughly, making sure to cover the front, back, and side of your shoulder, along with the armpit.   2 days before ____ AM   ____ PM              1 day before ____ AM   ____ PM                         Do this twice a day for two days.  (Last application is the night before surgery, AFTER using the CHG soap as described below).  Do NOT apply benzoyl peroxide gel on the day of surgery.    WHAT IS A BLOOD TRANSFUSION? Blood Transfusion Information  A transfusion is the replacement of blood or some of its parts. Blood is made up of multiple cells which provide different functions. Red blood cells carry oxygen and are used for blood loss replacement. White blood cells fight against infection. Platelets control bleeding. Plasma helps clot blood. Other blood products are available for specialized needs, such as hemophilia or other clotting disorders. BEFORE THE TRANSFUSION  Who gives blood for transfusions?  Healthy volunteers who are fully evaluated to make sure their blood is safe. This is blood bank blood. Transfusion therapy is the safest it has ever been in the practice of medicine. Before blood is taken from a donor, a complete history is taken to make sure that person has no history of diseases nor engages in risky social behavior (examples are intravenous drug use or sexual activity with multiple partners). The donor's travel history is screened to minimize risk of transmitting infections, such as malaria. The donated blood is tested for signs of infectious diseases, such as HIV and hepatitis. The blood is then tested to be sure it is compatible with you in order to minimize the chance of a transfusion reaction. If you or a relative donates blood, this is often done in anticipation of surgery and is not  appropriate for emergency situations. It takes many days to process the donated blood. RISKS AND COMPLICATIONS Although transfusion therapy is very safe and saves many lives, the main dangers of transfusion include:  Getting an infectious disease. Developing a transfusion reaction. This is an allergic reaction to something in the blood you were given. Every precaution is taken to prevent this. The decision to have a blood transfusion has been considered carefully by your caregiver before blood is given. Blood is not given unless the benefits outweigh the risks. AFTER THE TRANSFUSION Right after receiving a blood transfusion, you will usually feel much better and more energetic. This is especially true if your red blood cells have gotten low (anemic). The transfusion raises the level of the red blood cells which carry oxygen, and this usually causes an energy increase. The nurse administering the transfusion will monitor you  carefully for complications. HOME CARE INSTRUCTIONS  No special instructions are needed after a transfusion. You may find your energy is better. Speak with your caregiver about any limitations on activity for underlying diseases you may have. SEEK MEDICAL CARE IF:  Your condition is not improving after your transfusion. You develop redness or irritation at the intravenous (IV) site. SEEK IMMEDIATE MEDICAL CARE IF:  Any of the following symptoms occur over the next 12 hours: Shaking chills. You have a temperature by mouth above 102 F (38.9 C), not controlled by medicine. Chest, back, or muscle pain. People around you feel you are not acting correctly or are confused. Shortness of breath or difficulty breathing. Dizziness and fainting. You get a rash or develop hives. You have a decrease in urine output. Your urine turns a dark color or changes to pink, red, or brown. Any of the following symptoms occur over the next 10 days: You have a temperature by mouth above 102 F  (38.9 C), not controlled by medicine. Shortness of breath. Weakness after normal activity. The white part of the eye turns yellow (jaundice). You have a decrease in the amount of urine or are urinating less often. Your urine turns a dark color or changes to pink, red, or brown. Document Released: 02/15/2000 Document Revised: 05/12/2011 Document Reviewed: 10/04/2007 [facility] Patient Information 2014 [place], [place].  _______________________________________________________________________

## 2020-09-04 ENCOUNTER — Other Ambulatory Visit: Payer: Self-pay

## 2020-09-04 ENCOUNTER — Encounter (HOSPITAL_COMMUNITY)
Admission: RE | Admit: 2020-09-04 | Discharge: 2020-09-04 | Disposition: A | Discharge status: Home / Self Care | Payer: Medicare Other | Source: Ambulatory Visit | Attending: Orthopedic Surgery | Admitting: Orthopedic Surgery

## 2020-09-04 ENCOUNTER — Encounter (HOSPITAL_COMMUNITY): Payer: Self-pay

## 2020-09-04 DIAGNOSIS — Z01812 Encounter for preprocedural laboratory examination: Secondary | ICD-10-CM | POA: Diagnosis not present

## 2020-09-04 LAB — URINALYSIS, ROUTINE W REFLEX MICROSCOPIC
Bilirubin Urine: NEGATIVE
Glucose, UA: NEGATIVE mg/dL
Hgb urine dipstick: NEGATIVE
Ketones, ur: NEGATIVE mg/dL
Leukocytes,Ua: NEGATIVE
Nitrite: NEGATIVE
Protein, ur: NEGATIVE mg/dL
Specific Gravity, Urine: 1.01 (ref 1.005–1.030)
pH: 6 (ref 5.0–8.0)

## 2020-09-04 LAB — CBC WITH DIFFERENTIAL/PLATELET
Abs Immature Granulocytes: 0.02 10*3/uL (ref 0.00–0.07)
Basophils Absolute: 0 10*3/uL (ref 0.0–0.1)
Basophils Relative: 0 %
Eosinophils Absolute: 0.1 10*3/uL (ref 0.0–0.5)
Eosinophils Relative: 2 %
HCT: 38.4 % (ref 36.0–46.0)
Hemoglobin: 12.5 g/dL (ref 12.0–15.0)
Immature Granulocytes: 0 %
Lymphocytes Relative: 42 %
Lymphs Abs: 2.6 10*3/uL (ref 0.7–4.0)
MCH: 30.1 pg (ref 26.0–34.0)
MCHC: 32.6 g/dL (ref 30.0–36.0)
MCV: 92.5 fL (ref 80.0–100.0)
Monocytes Absolute: 0.6 10*3/uL (ref 0.1–1.0)
Monocytes Relative: 10 %
Neutro Abs: 2.8 10*3/uL (ref 1.7–7.7)
Neutrophils Relative %: 46 %
Platelets: 271 10*3/uL (ref 150–400)
RBC: 4.15 MIL/uL (ref 3.87–5.11)
RDW: 12.6 % (ref 11.5–15.5)
WBC: 6.1 10*3/uL (ref 4.0–10.5)
nRBC: 0 % (ref 0.0–0.2)

## 2020-09-04 LAB — COMPREHENSIVE METABOLIC PANEL
ALT: 14 U/L (ref 0–44)
AST: 18 U/L (ref 15–41)
Albumin: 4.3 g/dL (ref 3.5–5.0)
Alkaline Phosphatase: 63 U/L (ref 38–126)
Anion gap: 7 (ref 5–15)
BUN: 20 mg/dL (ref 8–23)
CO2: 28 mmol/L (ref 22–32)
Calcium: 9.7 mg/dL (ref 8.9–10.3)
Chloride: 105 mmol/L (ref 98–111)
Creatinine, Ser: 0.75 mg/dL (ref 0.44–1.00)
GFR, Estimated: >60 mL/min (ref >60)
Glucose, Bld: 140 mg/dL — ABNORMAL HIGH (ref 70–99)
Potassium: 3.8 mmol/L (ref 3.5–5.1)
Sodium: 140 mmol/L (ref 135–145)
Total Bilirubin: 0.7 mg/dL (ref 0.3–1.2)
Total Protein: 7 g/dL (ref 6.5–8.1)

## 2020-09-04 LAB — SURGICAL PCR SCREEN
MRSA, PCR: NEGATIVE
Staphylococcus aureus: NEGATIVE

## 2020-09-04 LAB — APTT: aPTT: 32 seconds (ref 24–36)

## 2020-09-04 LAB — GLUCOSE, CAPILLARY: Glucose-Capillary: 128 mg/dL — ABNORMAL HIGH (ref 70–99)

## 2020-09-06 NOTE — Progress Notes (Signed)
Anesthesia Chart Review:   Case: [id number] Date/Time: 09/13/20 1001   Procedure: REVERSE SHOULDER ARTHROPLASTY (Left: Shoulder)   Anesthesia type: Choice   Pre-op diagnosis: LEFT SHOULDER FRACTURE   Location: [facility] ROOM 07 / [facility]   Surgeons: Tania Ade, MD       DISCUSSION: Pt is 87 years old with hx HTN, DM, carotid bruit (Dr. Harrington Challenger' note 08/10/20 documents "no JVD, carotid bruits"), thyroid nodules  VS: BP (!) 136/57   Pulse (!) 59   Temp 37.1 C (Oral)   Resp 14   Ht 5' 5"  (1.651 m)   Wt 49.1 kg   SpO2 100%   BMI 18.01 kg/m   PROVIDERS: - PCP is Harlan Stains, MD - Cardiologist is Dorris Carnes, MD who cleared pt for surgery in comment on 08/15/20 stress test   LABS: Labs reviewed: Acceptable for surgery. (all labs ordered are listed, but only abnormal results are displayed)  Labs Reviewed  COMPREHENSIVE METABOLIC PANEL - Abnormal; Notable for the following components:      Result Value   Glucose, Bld 140 (*)    All other components within normal limits  URINALYSIS, ROUTINE W REFLEX MICROSCOPIC - Abnormal; Notable for the following components:   Color, Urine STRAW (*)    All other components within normal limits  GLUCOSE, CAPILLARY - Abnormal; Notable for the following components:   Glucose-Capillary 128 (*)    All other components within normal limits  SURGICAL PCR SCREEN  CBC WITH DIFFERENTIAL/PLATELET  APTT  TYPE AND SCREEN     IMAGES: CXR 07/11/20:  1. Stable 19 x 14 mm nodule, likely within the right lower lobe. CT chest is again recommended, if not performed in the interim.  Thyroid US 07/27/20: 1. Borderline thyromegaly with bilateral nodules. None currently meets criteria for biopsy. 2. Recommend annual/biennial ultrasound follow-up of inferior right nodule as above, until stability x5 years confirmed.   EKG 07/24/20: Sinus rhythm with 1st degree A-V block with PACs. Left bundle branch block - Dr. Harrington Challenger' note 08/10/20 interprets EKG as RS with  significant conduction delay with first degree AV block and IVCD (not LBBB)   CV: Nuclears stress test 08/15/20:  The left ventricular ejection fraction is hyperdynamic (>65%). Nuclear stress EF: 77%. There was no ST segment deviation noted during stress. The study is normal. This is a low risk study with no evidence of ischemia.  Carotid duplex 04/14/17:  - Less than 50% stenosis in the right and left internal carotid arteries.   Past Medical History:  Diagnosis Date   Allergies    Arthritis    BRCA2 positive 07/17/2017   Pathogenic BRCA2 mutation [id number] ([id number]) @ [facility]   Carotid bruit    Right (Doppler done in 2/19   Diabetes mellitus    Diverticulosis    Family history of BRCA2 gene positive    BRCA2 mutation in daughter   Genetic testing 07/17/2017   BRCA2 analysis @ [facility] - Familial pathogenic BRCA2 mutation detected   Hoarseness of voice    Chronic   Hypercholesteremia    Hypertension    Multiple thyroid nodules    Skin cancer    Head   Spasmodic dysphonia    multinodular goiter   Vitamin B12 deficiency    Vitamin D deficiency     Past Surgical History:  Procedure Laterality Date   ABDOMINAL HYSTERECTOMY     BIOPSY THYROID     CHOLECYSTECTOMY     COLONOSCOPY     excision of skin  cancer     Head   GALLBLADDER SURGERY     HERNIA REPAIR Right    LIPOMA EXCISION     OOPHORECTOMY     VENTRAL HERNIA REPAIR Right 01/20/2019   Procedure: LAPAROSCOPIC RIGHT SPIGELAIN HERNIA REPAIR WITH MESH;  Surgeon: Johnathan Hausen, MD;  Location: [facility];  Service: General;  Laterality: Right;    MEDICATIONS:  amLODipine (NORVASC) 2.5 MG tablet   calcium carbonate (TUMS - DOSED IN MG ELEMENTAL CALCIUM) 500 MG chewable tablet   fluticasone (FLONASE) 50 MCG/ACT nasal spray   ibuprofen (ADVIL) 200 MG tablet   lisinopril (ZESTRIL) 10 MG tablet   metFORMIN (GLUCOPHAGE-XR) 750 MG 24 hr tablet   pravastatin (PRAVACHOL) 80 MG tablet   vitamin B-12  (CYANOCOBALAMIN) 1000 MCG tablet   Vitamin D, Ergocalciferol, (DRISDOL) 1.25 MG (50000 UT) CAPS capsule   No current facility-administered medications for this encounter.    If no changes, I anticipate pt can proceed with surgery as scheduled.   Sherry Cass, PhD, FNP-BC [facility] Short Stay Surgical Center/Anesthesiology Phone: [phone] 09/06/2020 10:40 AM

## 2020-09-06 NOTE — Anesthesia Preprocedure Evaluation (Addendum)
Anesthesia Evaluation  Patient identified by MRN, date of birth, ID band Patient awake    Reviewed: Allergy & Precautions, NPO status , Patient's Chart, lab work & pertinent test results  Airway Mallampati: II  TM Distance: >3 FB Neck ROM: Full    Dental no notable dental hx. (+) Teeth Intact, Dental Advisory Given   Pulmonary neg pulmonary ROS,    Pulmonary exam normal breath sounds clear to auscultation       Cardiovascular hypertension, Pt. on medications Normal cardiovascular exam Rhythm:Regular Rate:Normal  Myo Perfusion Scan 08/15/2020 ? The left ventricular ejection fraction is hyperdynamic (>65%). ? Nuclear stress EF: 77%. ? There was no ST segment deviation noted during stress. ? The study is normal. ? This is a low risk study with no evidence of ischemia.      Neuro/Psych negative neurological ROS     GI/Hepatic Neg liver ROS,   Endo/Other  diabetes  Renal/GU Lab Results      Component                Value               Date                      CREATININE               0.75                09/04/2020                BUN                      20                  09/04/2020                NA                       140                 09/04/2020                K                        3.8                 09/04/2020                CL                       105                 09/04/2020                CO2                      28                  09/04/2020                Musculoskeletal   Abdominal   Peds  Hematology Lab Results      Component                Value               Date  WBC                      6.1                 09/04/2020                HGB                      12.5                09/04/2020                HCT                      38.4                09/04/2020                MCV                      92.5                09/04/2020                PLT                       271                 09/04/2020              Anesthesia Other Findings ALL Darvon, Bactrim, Zithromax codeine LAtex  Reproductive/Obstetrics                           Anesthesia Physical Anesthesia Plan  ASA: 3  Anesthesia Plan: General   Post-op Pain Management:  Regional for Post-op pain   Induction: Intravenous  PONV Risk Score and Plan: 3 and Treatment may vary due to age or medical condition, Ondansetron and TIVA  Airway Management Planned: Oral ETT  Additional Equipment: None  Intra-op Plan:   Post-operative Plan: Extubation in OR  Informed Consent: I have reviewed the patients History and Physical, chart, labs and discussed the procedure including the risks, benefits and alternatives for the proposed anesthesia with the patient or authorized representative who has indicated his/her understanding and acceptance.     Dental advisory given  Plan Discussed with: CRNA and Anesthesiologist  Anesthesia Plan Comments: (See APP note by Durel Salts, FNP   GA w L ISB)     Anesthesia Quick Evaluation

## 2020-09-13 ENCOUNTER — Encounter (HOSPITAL_COMMUNITY)
Admission: RE | Disposition: A | Discharge status: Home / Self Care | Payer: Self-pay | Source: Ambulatory Visit | Attending: Orthopedic Surgery

## 2020-09-13 ENCOUNTER — Ambulatory Visit (HOSPITAL_COMMUNITY): Payer: Medicare Other | Admitting: Anesthesiology

## 2020-09-13 ENCOUNTER — Ambulatory Visit (HOSPITAL_COMMUNITY): Payer: Medicare Other

## 2020-09-13 ENCOUNTER — Ambulatory Visit (HOSPITAL_COMMUNITY): Payer: Medicare Other | Admitting: Emergency Medicine

## 2020-09-13 ENCOUNTER — Encounter (HOSPITAL_COMMUNITY): Payer: Self-pay | Admitting: Orthopedic Surgery

## 2020-09-13 ENCOUNTER — Ambulatory Visit (HOSPITAL_COMMUNITY)
Admission: RE | Admit: 2020-09-13 | Discharge: 2020-09-13 | Disposition: A | Discharge status: Home / Self Care | Payer: Medicare Other | Source: Ambulatory Visit | Attending: Orthopedic Surgery | Admitting: Orthopedic Surgery

## 2020-09-13 DIAGNOSIS — Z7984 Long term (current) use of oral hypoglycemic drugs: Secondary | ICD-10-CM | POA: Insufficient documentation

## 2020-09-13 DIAGNOSIS — S4292XA Fracture of left shoulder girdle, part unspecified, initial encounter for closed fracture: Secondary | ICD-10-CM | POA: Diagnosis not present

## 2020-09-13 DIAGNOSIS — Z800 Family history of malignant neoplasm of digestive organs: Secondary | ICD-10-CM | POA: Insufficient documentation

## 2020-09-13 DIAGNOSIS — X58XXXA Exposure to other specified factors, initial encounter: Secondary | ICD-10-CM | POA: Insufficient documentation

## 2020-09-13 DIAGNOSIS — E559 Vitamin D deficiency, unspecified: Secondary | ICD-10-CM | POA: Diagnosis not present

## 2020-09-13 DIAGNOSIS — Z8249 Family history of ischemic heart disease and other diseases of the circulatory system: Secondary | ICD-10-CM | POA: Diagnosis not present

## 2020-09-13 DIAGNOSIS — Z888 Allergy status to other drugs, medicaments and biological substances status: Secondary | ICD-10-CM | POA: Diagnosis not present

## 2020-09-13 DIAGNOSIS — S42202A Unspecified fracture of upper end of left humerus, initial encounter for closed fracture: Secondary | ICD-10-CM | POA: Diagnosis not present

## 2020-09-13 DIAGNOSIS — E118 Type 2 diabetes mellitus with unspecified complications: Secondary | ICD-10-CM | POA: Insufficient documentation

## 2020-09-13 DIAGNOSIS — Z01811 Encounter for preprocedural respiratory examination: Secondary | ICD-10-CM

## 2020-09-13 DIAGNOSIS — Z882 Allergy status to sulfonamides status: Secondary | ICD-10-CM | POA: Insufficient documentation

## 2020-09-13 DIAGNOSIS — Z885 Allergy status to narcotic agent status: Secondary | ICD-10-CM | POA: Diagnosis not present

## 2020-09-13 DIAGNOSIS — S42252S Displaced fracture of greater tuberosity of left humerus, sequela: Secondary | ICD-10-CM | POA: Diagnosis not present

## 2020-09-13 DIAGNOSIS — Z791 Long term (current) use of non-steroidal anti-inflammatories (NSAID): Secondary | ICD-10-CM | POA: Insufficient documentation

## 2020-09-13 DIAGNOSIS — I10 Essential (primary) hypertension: Secondary | ICD-10-CM | POA: Diagnosis not present

## 2020-09-13 DIAGNOSIS — R531 Weakness: Secondary | ICD-10-CM | POA: Diagnosis not present

## 2020-09-13 DIAGNOSIS — R52 Pain, unspecified: Secondary | ICD-10-CM | POA: Diagnosis not present

## 2020-09-13 DIAGNOSIS — Z91040 Latex allergy status: Secondary | ICD-10-CM | POA: Insufficient documentation

## 2020-09-13 DIAGNOSIS — S43005S Unspecified dislocation of left shoulder joint, sequela: Secondary | ICD-10-CM | POA: Diagnosis not present

## 2020-09-13 DIAGNOSIS — Z803 Family history of malignant neoplasm of breast: Secondary | ICD-10-CM | POA: Diagnosis not present

## 2020-09-13 DIAGNOSIS — Z79899 Other long term (current) drug therapy: Secondary | ICD-10-CM | POA: Insufficient documentation

## 2020-09-13 DIAGNOSIS — Z7409 Other reduced mobility: Secondary | ICD-10-CM | POA: Insufficient documentation

## 2020-09-13 DIAGNOSIS — Z96642 Presence of left artificial hip joint: Secondary | ICD-10-CM | POA: Diagnosis not present

## 2020-09-13 DIAGNOSIS — R6889 Other general symptoms and signs: Secondary | ICD-10-CM | POA: Insufficient documentation

## 2020-09-13 DIAGNOSIS — Z96612 Presence of left artificial shoulder joint: Secondary | ICD-10-CM

## 2020-09-13 DIAGNOSIS — Z881 Allergy status to other antibiotic agents status: Secondary | ICD-10-CM | POA: Insufficient documentation

## 2020-09-13 DIAGNOSIS — Z471 Aftercare following joint replacement surgery: Secondary | ICD-10-CM | POA: Diagnosis not present

## 2020-09-13 DIAGNOSIS — Y939 Activity, unspecified: Secondary | ICD-10-CM | POA: Insufficient documentation

## 2020-09-13 DIAGNOSIS — Z886 Allergy status to analgesic agent status: Secondary | ICD-10-CM | POA: Diagnosis not present

## 2020-09-13 DIAGNOSIS — G8918 Other acute postprocedural pain: Secondary | ICD-10-CM | POA: Diagnosis not present

## 2020-09-13 HISTORY — PX: REVERSE SHOULDER ARTHROPLASTY: SHX5054

## 2020-09-13 LAB — TYPE AND SCREEN
ABO/RH(D): O POS
Antibody Screen: NEGATIVE

## 2020-09-13 LAB — GLUCOSE, CAPILLARY: Glucose-Capillary: 137 mg/dL — ABNORMAL HIGH (ref 70–99)

## 2020-09-13 SURGERY — ARTHROPLASTY, SHOULDER, TOTAL, REVERSE
Anesthesia: General | Site: Shoulder | Laterality: Left

## 2020-09-13 MED ORDER — FENTANYL CITRATE (PF) 100 MCG/2ML IJ SOLN: 25.0000 ug | INTRAMUSCULAR | Status: DC | PRN

## 2020-09-13 MED ORDER — METHOCARBAMOL 500 MG IVPB - SIMPLE MED: 500.0000 mg | Freq: Four times a day (QID) | INTRAVENOUS | Status: DC | PRN

## 2020-09-13 MED ORDER — DEXAMETHASONE SODIUM PHOSPHATE 10 MG/ML IJ SOLN
INTRAMUSCULAR | Status: DC | PRN
  Administered 2020-09-13: 5 mg via INTRAVENOUS

## 2020-09-13 MED ORDER — LACTATED RINGERS IV SOLN: INTRAVENOUS | Status: DC

## 2020-09-13 MED ORDER — SUGAMMADEX SODIUM 200 MG/2ML IV SOLN
INTRAVENOUS | Status: DC | PRN
  Administered 2020-09-13: 100 mg via INTRAVENOUS

## 2020-09-13 MED ORDER — TRANEXAMIC ACID-NACL 1000-0.7 MG/100ML-% IV SOLN
1000.0000 mg | INTRAVENOUS | Status: AC
  Administered 2020-09-13: 1000 mg via INTRAVENOUS
  Filled 2020-09-13: qty 100

## 2020-09-13 MED ORDER — PHENYLEPHRINE 40 MCG/ML (10ML) SYRINGE FOR IV PUSH (FOR BLOOD PRESSURE SUPPORT)
PREFILLED_SYRINGE | INTRAVENOUS | Status: DC | PRN
  Administered 2020-09-13 (×3): 40 ug via INTRAVENOUS
  Administered 2020-09-13: 80 ug via INTRAVENOUS
  Administered 2020-09-13 (×2): 40 ug via INTRAVENOUS

## 2020-09-13 MED ORDER — ROCURONIUM BROMIDE 100 MG/10ML IV SOLN
INTRAVENOUS | Status: DC | PRN
  Administered 2020-09-13: 10 mg via INTRAVENOUS
  Administered 2020-09-13: 30 mg via INTRAVENOUS

## 2020-09-13 MED ORDER — ONDANSETRON HCL 4 MG/2ML IJ SOLN
INTRAMUSCULAR | Status: DC | PRN
  Administered 2020-09-13: 4 mg via INTRAVENOUS

## 2020-09-13 MED ORDER — ROCURONIUM BROMIDE 10 MG/ML (PF) SYRINGE
PREFILLED_SYRINGE | INTRAVENOUS | Status: AC
  Filled 2020-09-13: qty 10

## 2020-09-13 MED ORDER — 0.9 % SODIUM CHLORIDE (POUR BTL) OPTIME
TOPICAL | Status: DC | PRN
  Administered 2020-09-13: 1000 mL

## 2020-09-13 MED ORDER — CHLORHEXIDINE GLUCONATE 0.12 % MT SOLN
15.0000 mL | Freq: Once | OROMUCOSAL | Status: AC
  Administered 2020-09-13: 15 mL via OROMUCOSAL

## 2020-09-13 MED ORDER — PROPOFOL 500 MG/50ML IV EMUL
INTRAVENOUS | Status: DC | PRN
  Administered 2020-09-13: 125 ug/kg/min via INTRAVENOUS

## 2020-09-13 MED ORDER — FENTANYL CITRATE (PF) 100 MCG/2ML IJ SOLN
INTRAMUSCULAR | Status: DC | PRN
  Administered 2020-09-13 (×2): 25 ug via INTRAVENOUS

## 2020-09-13 MED ORDER — SODIUM CHLORIDE 0.9 % IV SOLN
2.0000 g | INTRAVENOUS | Status: AC
  Administered 2020-09-13: 2 g via INTRAVENOUS
  Filled 2020-09-13: qty 2

## 2020-09-13 MED ORDER — TRANEXAMIC ACID-NACL 1000-0.7 MG/100ML-% IV SOLN: 1000.0000 mg | INTRAVENOUS | Status: DC

## 2020-09-13 MED ORDER — WATER FOR IRRIGATION, STERILE IR SOLN
Status: DC | PRN
  Administered 2020-09-13: 2000 mL

## 2020-09-13 MED ORDER — ONDANSETRON HCL 4 MG/2ML IJ SOLN: 4.0000 mg | Freq: Once | INTRAMUSCULAR | Status: DC | PRN

## 2020-09-13 MED ORDER — OXYCODONE HCL 5 MG PO TABS
5.0000 mg | ORAL_TABLET | ORAL | Status: DC | PRN
Start: 2020-09-13 — End: 2020-09-13

## 2020-09-13 MED ORDER — ORAL CARE MOUTH RINSE: 15.0000 mL | Freq: Once | OROMUCOSAL | Status: AC

## 2020-09-13 MED ORDER — PROPOFOL 10 MG/ML IV BOLUS
INTRAVENOUS | Status: AC
  Filled 2020-09-13: qty 40

## 2020-09-13 MED ORDER — SODIUM CHLORIDE 0.9 % IV SOLN: 2.0000 g | INTRAVENOUS | Status: DC

## 2020-09-13 MED ORDER — BUPIVACAINE HCL (PF) 0.5 % IJ SOLN
INTRAMUSCULAR | Status: DC | PRN
  Administered 2020-09-13: 10 mL via PERINEURAL

## 2020-09-13 MED ORDER — PROPOFOL 10 MG/ML IV BOLUS
INTRAVENOUS | Status: AC
  Filled 2020-09-13: qty 20

## 2020-09-13 MED ORDER — TIZANIDINE HCL 2 MG PO TABS: 2.0000 mg | ORAL_TABLET | Freq: Three times a day (TID) | ORAL | 0 refills | Status: DC | PRN

## 2020-09-13 MED ORDER — LIDOCAINE 2% (20 MG/ML) 5 ML SYRINGE
INTRAMUSCULAR | Status: DC | PRN
  Administered 2020-09-13: 50 mg via INTRAVENOUS

## 2020-09-13 MED ORDER — FENTANYL CITRATE (PF) 100 MCG/2ML IJ SOLN
INTRAMUSCULAR | Status: AC
  Filled 2020-09-13: qty 2

## 2020-09-13 MED ORDER — BUPIVACAINE LIPOSOME 1.3 % IJ SUSP
INTRAMUSCULAR | Status: DC | PRN
  Administered 2020-09-13: 10 mL via PERINEURAL

## 2020-09-13 MED ORDER — PROPOFOL 10 MG/ML IV BOLUS
INTRAVENOUS | Status: DC | PRN
  Administered 2020-09-13: 80 mg via INTRAVENOUS
  Administered 2020-09-13: 10 mg via INTRAVENOUS

## 2020-09-13 MED ORDER — LIDOCAINE 2% (20 MG/ML) 5 ML SYRINGE
INTRAMUSCULAR | Status: AC
  Filled 2020-09-13: qty 5

## 2020-09-13 MED ORDER — DEXAMETHASONE SODIUM PHOSPHATE 10 MG/ML IJ SOLN
INTRAMUSCULAR | Status: AC
  Filled 2020-09-13: qty 1

## 2020-09-13 MED ORDER — METHOCARBAMOL 500 MG PO TABS: 500.0000 mg | ORAL_TABLET | Freq: Four times a day (QID) | ORAL | Status: DC | PRN

## 2020-09-13 MED ORDER — ONDANSETRON HCL 4 MG PO TABS: 4.0000 mg | ORAL_TABLET | Freq: Every day | ORAL | 0 refills | Status: DC | PRN

## 2020-09-13 MED ORDER — OXYCODONE HCL 5 MG PO TABS: 10.0000 mg | ORAL_TABLET | ORAL | Status: DC | PRN

## 2020-09-13 MED ORDER — HYDROCODONE-ACETAMINOPHEN 5-325 MG PO TABS: 1.0000 | ORAL_TABLET | Freq: Four times a day (QID) | ORAL | 0 refills | Status: DC | PRN

## 2020-09-13 MED ORDER — ONDANSETRON HCL 4 MG/2ML IJ SOLN
INTRAMUSCULAR | Status: AC
  Filled 2020-09-13: qty 2

## 2020-09-13 MED ORDER — SODIUM CHLORIDE 0.9 % IR SOLN
Status: DC | PRN
  Administered 2020-09-13: 1000 mL

## 2020-09-13 MED ORDER — ACETAMINOPHEN 10 MG/ML IV SOLN: 700.0000 mg | Freq: Once | INTRAVENOUS | Status: DC | PRN

## 2020-09-13 MED ORDER — OXYCODONE HCL 5 MG PO TABS: 5.0000 mg | ORAL_TABLET | ORAL | Status: DC | PRN

## 2020-09-13 SURGICAL SUPPLY — 72 items
BAG COUNTER SPONGE SURGICOUNT (BAG) IMPLANT
BAG ZIPLOCK 12X15 (MISCELLANEOUS) ×2 IMPLANT
BASEPLATE P2 COATD GLND 6.5X30 (Shoulder) ×1 IMPLANT
BIT DRILL 1.6MX128 (BIT) IMPLANT
BIT DRILL 2.5 DIA 127 CALI (BIT) ×2 IMPLANT
BIT DRILL 4 DIA CALIBRATED (BIT) ×2 IMPLANT
BLADE SAW SAG 73X25 THK (BLADE) ×1
BLADE SAW SGTL 73X25 THK (BLADE) ×1 IMPLANT
BOOTIES KNEE HIGH SLOAN (MISCELLANEOUS) ×4 IMPLANT
COOLER ICEMAN CLASSIC (MISCELLANEOUS) IMPLANT
COVER BACK TABLE 60X90IN (DRAPES) ×2 IMPLANT
COVER SURGICAL LIGHT HANDLE (MISCELLANEOUS) ×2 IMPLANT
DRAPE INCISE IOBAN 66X45 STRL (DRAPES) ×2 IMPLANT
DRAPE ORTHO SPLIT 77X108 STRL (DRAPES) ×4
DRAPE POUCH INSTRU U-SHP 10X18 (DRAPES) ×2 IMPLANT
DRAPE SHEET LG 3/4 BI-LAMINATE (DRAPES) ×2 IMPLANT
DRAPE SURG 17X11 SM STRL (DRAPES) ×2 IMPLANT
DRAPE SURG ORHT 6 SPLT 77X108 (DRAPES) ×2 IMPLANT
DRAPE TOP 10253 STERILE (DRAPES) ×2 IMPLANT
DRAPE U-SHAPE 47X51 STRL (DRAPES) ×2 IMPLANT
DRSG AQUACEL AG ADV 3.5X 6 (GAUZE/BANDAGES/DRESSINGS) ×2 IMPLANT
DURAPREP 26ML APPLICATOR (WOUND CARE) ×4 IMPLANT
ELECT BLADE TIP CTD 4 INCH (ELECTRODE) ×2 IMPLANT
ELECT REM PT RETURN 15FT ADLT (MISCELLANEOUS) ×2 IMPLANT
GLOVE SRG 8 PF TXTR STRL LF DI (GLOVE) ×1 IMPLANT
GLOVE SURG ENC MOIS LTX SZ6.5 (GLOVE) ×2 IMPLANT
GLOVE SURG ENC MOIS LTX SZ7.5 (GLOVE) ×2 IMPLANT
GLOVE SURG UNDER POLY LF SZ6.5 (GLOVE) ×2 IMPLANT
GLOVE SURG UNDER POLY LF SZ8 (GLOVE) ×2
GOWN STRL REUS W/TWL LRG LVL3 (GOWN DISPOSABLE) ×2 IMPLANT
GOWN STRL REUS W/TWL XL LVL3 (GOWN DISPOSABLE) ×2 IMPLANT
HANDPIECE INTERPULSE COAX TIP (DISPOSABLE) ×2
HOOD PEEL AWAY FLYTE STAYCOOL (MISCELLANEOUS) ×6 IMPLANT
INSERT SMALL SOCKET 32MM NEU (Insert) ×2 IMPLANT
KIT BASIN OR (CUSTOM PROCEDURE TRAY) ×2 IMPLANT
KIT TURNOVER KIT A (KITS) ×2 IMPLANT
MANIFOLD NEPTUNE II (INSTRUMENTS) ×2 IMPLANT
NEEDLE TROCAR POINT SZ 2 1/2 (NEEDLE) IMPLANT
NS IRRIG 1000ML POUR BTL (IV SOLUTION) ×2 IMPLANT
P2 COATDE GLNOID BSEPLT 6.5X30 (Shoulder) ×2 IMPLANT
PACK SHOULDER (CUSTOM PROCEDURE TRAY) ×2 IMPLANT
PAD COLD SHLDR WRAP-ON (PAD) IMPLANT
PROTECTOR NERVE ULNAR (MISCELLANEOUS) IMPLANT
RESTRAINT HEAD UNIVERSAL NS (MISCELLANEOUS) IMPLANT
RETRIEVER SUT HEWSON (MISCELLANEOUS) IMPLANT
SCREW BONE LOCKING RSP 5.0X14 (Screw) ×4 IMPLANT
SCREW BONE LOCKING RSP 5.0X30 (Screw) ×4 IMPLANT
SCREW BONE RSP LOCK 5X14 (Screw) ×2 IMPLANT
SCREW BONE RSP LOCK 5X30 (Screw) ×2 IMPLANT
SCREW RETAIN W/HEAD 4MM OFFSET (Shoulder) ×2 IMPLANT
SET HNDPC FAN SPRY TIP SCT (DISPOSABLE) ×1 IMPLANT
SHELL HUMERAL STEM 6X48 SM (Shell) ×2 IMPLANT
SLING ARM FOAM STRAP SML (SOFTGOODS) ×2 IMPLANT
SLING ARM IMMOBILIZER LRG (SOFTGOODS) IMPLANT
SLING ARM IMMOBILIZER MED (SOFTGOODS) IMPLANT
SPONGE T-LAP 18X18 ~~LOC~~+RFID (SPONGE) ×2 IMPLANT
STRIP CLOSURE SKIN 1/2X4 (GAUZE/BANDAGES/DRESSINGS) ×4 IMPLANT
SUCTION FRAZIER HANDLE 10FR (MISCELLANEOUS)
SUCTION TUBE FRAZIER 10FR DISP (MISCELLANEOUS) IMPLANT
SUPPORT WRAP ARM LG (MISCELLANEOUS) IMPLANT
SUT ETHIBOND 2 V 37 (SUTURE) IMPLANT
SUT FIBERWIRE #2 38 REV NDL BL (SUTURE)
SUT MNCRL AB 4-0 PS2 18 (SUTURE) ×2 IMPLANT
SUT VIC AB 2-0 CT1 27 (SUTURE) ×2
SUT VIC AB 2-0 CT1 TAPERPNT 27 (SUTURE) ×1 IMPLANT
SUTURE FIBERWR#2 38 REV NDL BL (SUTURE) IMPLANT
TAPE LABRALWHITE 1.5X36 (TAPE) IMPLANT
TAPE STRIPS DRAPE STRL (GAUZE/BANDAGES/DRESSINGS) ×2 IMPLANT
TAPE SUT LABRALTAP WHT/BLK (SUTURE) IMPLANT
TOWEL OR 17X26 10 PK STRL BLUE (TOWEL DISPOSABLE) ×2 IMPLANT
TOWEL OR NON WOVEN STRL DISP B (DISPOSABLE) ×2 IMPLANT
WATER STERILE IRR 1000ML POUR (IV SOLUTION) ×2 IMPLANT

## 2020-09-13 NOTE — Op Note (Addendum)
Procedure(s): REVERSE SHOULDER ARTHROPLASTY Procedure Note  Sherry Oconnor female 87 y.o. 09/13/2020  Preoperative diagnosis: Status post left shoulder fracture dislocation with chronic pain and dysfunction  Postoperative diagnosis: Same  Procedure(s) and Anesthesia Type:    * REVERSE SHOULDER ARTHROPLASTY - Choice   Indications:  87 y.o. female status post left shoulder fracture dislocation with chronic pain and dysfunction, failed extensive conservative management.  Surgeon: Isabella Stalling   Assistants: Sheryle Hail PA-C ([address] was present and scrubbed throughout the procedure and was essential in positioning, retraction, exposure, and closure)  Anesthesia: General endotracheal anesthesia with preoperative interscalene block given by the attending anesthesiologist     Procedure Detail  REVERSE SHOULDER ARTHROPLASTY   Estimated Blood Loss:  200 mL         Drains: none  Blood Given: none          Specimens: none        Complications:  * No complications entered in OR log *         Disposition: PACU - hemodynamically stable.         Condition: stable      OPERATIVE FINDINGS:  A DJO Altivate pressfit reverse total shoulder arthroplasty was placed with a  size 6 stem, a 32-4 glenosphere, and a standard-mm poly insert. The base plate  fixation was excellent.  PROCEDURE: The patient was identified in the preoperative holding area  where I personally marked the operative site after verifying site, side,  and procedure with the patient. An interscalene block given by  the attending anesthesiologist in the holding area and the patient was taken back to the operating room where all extremities were  carefully padded in position after general anesthesia was induced. She  was placed in a beach-chair position and the operative upper extremity was  prepped and draped in a standard sterile fashion. An approximately 10-  cm incision was made from the tip of the  coracoid process to the center  point of the humerus at the level of the axilla. Dissection was carried  down through subcutaneous tissues to the level of the cephalic vein  which was taken laterally with the deltoid. The pectoralis major was  retracted medially. The subdeltoid space was developed and the lateral  edge of the conjoined tendon was identified. The undersurface of  conjoined tendon was palpated and the musculocutaneous nerve was not in  the field. Retractor was placed underneath the conjoined and second  retractor was placed lateral into the deltoid. The circumflex humeral  artery and vessels were identified and clamped and coagulated. The  biceps tendon was tenotomized.  The subscapularis was taken down as a peel with the underlying capsule.  The  joint was then gently externally rotated while the capsule was released  from the humeral neck around to just beyond the 6 o'clock position. At  this point, the joint was dislocated and the humeral head was presented  into the wound. The excessive osteophyte formation was removed with a  large rongeur.  The cutting guide was used to make the appropriate  head cut and the head was saved for potentially bone grafting.  The glenoid was exposed with the arm in an  abducted extended position. The anterior and posterior labrum were  completely excised and the capsule was released circumferentially to  allow for exposure of the glenoid for preparation. The 2.5 mm drill was  placed using the guide in 5-10 inferior angulation and the tap was then advanced in  the same hole. Small and large reamers were then used. The tap was then removed and the Metaglene was then screwed in with excellent purchase.  The peripheral guide was then used to drilled measured and filled peripheral locking screws. The size 32-4 glenosphere was then impacted on the [facility] taper and the central screw was placed. The humerus was then again exposed and the diaphyseal reamers  were used followed by the metaphyseal reamers.  A fair amount of the tuberosity was missing, however enough peripheral bone remained and metaphyseal bone that fixation was excellent.  The final broach was left in place in the proximal trial was placed. The joint was reduced and with this implant it was felt that soft tissue tensioning was appropriate with excellent stability and excellent range of motion. Therefore, final humeral stem was placed press-fit .  And then the trial polyethylene inserts were tested again and the above implant was felt to be the most appropriate for final insertion. The joint was reduced taken through full range of motion and felt to be stable. Soft tissue tension was appropriate.  The joint was then copiously irrigated with pulse  lavage and the wound was then closed. The subscapularis was not repaired.  Skin was closed with 2-0 Vicryl in a deep dermal layer and 4-0  Monocryl for skin closure. Steri-Strips were applied. Sterile  dressings were then applied as well as a sling. The patient was allowed  to awaken from general anesthesia, transferred to stretcher, and taken  to recovery room in stable condition.   POSTOPERATIVE PLAN: The patient wishes to go home today as long as she is hemodynamically stable and has a good regional anesthesia.  She has good help at home and would rather not stay for observation.  Anesthesia feels based on her risk factors that this is also a reasonable choice.

## 2020-09-13 NOTE — Anesthesia Procedure Notes (Signed)
Anesthesia Regional Block: Interscalene brachial plexus block   Pre-Anesthetic Checklist: , timeout performed,  Correct Patient, Correct Site, Correct Laterality,  Correct Procedure, Correct Position, site marked,  Risks and benefits discussed,  Surgical consent,  Pre-op evaluation,  At surgeon's request and post-op pain management  Laterality: Upper and Left  Prep: Maximum Sterile Barrier Precautions used, chloraprep       Needles:  Injection technique: Single-shot  Needle Type: Echogenic Needle     Needle Length: 5cm  Needle Gauge: 21     Additional Needles:   Procedures:,,,, ultrasound used (permanent image in chart),,    Narrative:  Start time: 09/13/2020 7:02 AM End time: 09/13/2020 7:09 AM Injection made incrementally with aspirations every 5 mL.  Performed by: Personally  Anesthesiologist: Barnet Glasgow, MD  Additional Notes: Block assessed prior to procedure. Patient tolerated procedure well.

## 2020-09-13 NOTE — Evaluation (Signed)
Occupational Therapy Evaluation Patient Details Name: Sherry Oconnor MRN: [id number] DOB: Apr 30, 1931 Today's Date: 09/13/2020    History of Present Illness Patient s/p left  rTSA   Clinical Impression   Mrs. Sherry Oconnor is an 87 year old woman s/p shoulder replacement without functional use of left non-dominant upper extremity secondary to effects of surgery, interscalene block and shoulder precautions. Therapist provided education and instruction to patient and daughter in regards to exercises, precautions, positioning, donning upper extremity clothing and bathing while maintaining shoulder precautions, ice and edema management with use of Iceman cooler and cuff and donning/doffing sling. Patient and daughter verbalized understanding. Handouts provided to maximize retention of education. Patient to follow up with MD for further therapy needs.      Follow Up Recommendations  Follow surgeon's recommendation for DC plan and follow-up therapies    Equipment Recommendations  None recommended by OT    Recommendations for Other Services       Precautions / Restrictions Precautions Precautions: Shoulder Type of Shoulder Precautions: No AROM/PROM of shoulder, okay for elbow wrist and hand Shoulder Interventions: Shoulder sling/immobilizer;At all times Precaution Booklet Issued: Yes (comment) (handouts) Required Braces or Orthoses: Sling Restrictions Weight Bearing Restrictions: Yes      Mobility Bed Mobility                    Transfers Overall transfer level: Independent                    Balance Overall balance assessment: Mild deficits observed, not formally tested                                         ADL either performed or assessed with clinical judgement   ADL Overall ADL's : Modified independent                                             Vision Patient Visual Report: No change from baseline       Perception      Praxis      Pertinent Vitals/Pain Pain Assessment: No/denies pain (secondary to block)     Hand Dominance     Extremity/Trunk Assessment Upper Extremity Assessment Upper Extremity Assessment: LUE deficits/detail LUE Deficits / Details: limited by effects of block           Communication     Cognition Arousal/Alertness: Awake/alert Behavior During Therapy: WFL for tasks assessed/performed Overall Cognitive Status: Within Functional Limits for tasks assessed                                     General Comments       Exercises     Shoulder Instructions Shoulder Instructions Donning/doffing shirt without moving shoulder: Independent Method for sponge bathing under operated UE: Independent Donning/doffing sling/immobilizer: Caregiver independent with task Correct positioning of sling/immobilizer: Independent ROM for elbow, wrist and digits of operated UE: Independent Sling wearing schedule (on at all times/off for ADL's): Independent Proper positioning of operated UE when showering: Independent Dressing change: Independent Positioning of UE while sleeping: [place] expects to be discharged to:: Private residence Living Arrangements: Children Available Help at  Discharge: Family                                    Prior Functioning/Environment                   OT Problem List: Decreased strength;Decreased range of motion;Impaired UE functional use;Pain      OT Treatment/Interventions:      OT Goals(Current goals can be found in the care plan section) Acute Rehab OT Goals OT Goal Formulation: All assessment and education complete, DC therapy  OT Frequency:     Barriers to D/C:            Co-evaluation              AM-PAC OT "6 Clicks" Daily Activity     Outcome Measure Help from another person eating meals?: None Help from another person taking care of personal grooming?:  None Help from another person toileting, which includes using toliet, bedpan, or urinal?: None Help from another person bathing (including washing, rinsing, drying)?: None Help from another person to put on and taking off regular upper body clothing?: None Help from another person to put on and taking off regular lower body clothing?: None 6 Click Score: 24   End of Session Nurse Communication:  (Ot education complete)  Activity Tolerance: Patient tolerated treatment well Patient left: in chair;with family/visitor present  OT Visit Diagnosis: Pain                Time: 3419-6222 OT Time Calculation (min): 21 min Charges:  OT General Charges $OT Visit: 1 Visit OT Evaluation $OT Eval Low Complexity: 1 Low  Sherry Oconnor, OTR/L [place]  Office [phone] Pager: [phone]   Sherry Oconnor 09/13/2020, 11:15 AM

## 2020-09-13 NOTE — Transfer of Care (Signed)
Immediate Anesthesia Transfer of Care Note  Patient: Uri Covey  Procedure(s) Performed: REVERSE SHOULDER ARTHROPLASTY (Left: Shoulder)  Patient Location: PACU  Anesthesia Type:General and Regional  Level of Consciousness: drowsy and patient cooperative  Airway & Oxygen Therapy: Patient Spontanous Breathing and Patient connected to face mask oxygen  Post-op Assessment: Report given to RN and Post -op Vital signs reviewed and stable  Post vital signs: Reviewed and stable  Last Vitals:  Vitals Value Taken Time  BP 109/67 09/13/20 0901  Temp    Pulse 73 09/13/20 0903  Resp 14 09/13/20 0903  SpO2 100 % 09/13/20 0903  Vitals shown include unvalidated device data.  Last Pain:  Vitals:   09/13/20 0605  TempSrc: Oral  PainSc:          Complications: No notable events documented.

## 2020-09-13 NOTE — Anesthesia Postprocedure Evaluation (Signed)
Anesthesia Post Note  Patient: Sherry Oconnor  Procedure(s) Performed: REVERSE SHOULDER ARTHROPLASTY (Left: Shoulder)     Patient location during evaluation: PACU Anesthesia Type: General Level of consciousness: awake and alert Pain management: pain level controlled Vital Signs Assessment: post-procedure vital signs reviewed and stable Respiratory status: spontaneous breathing, nonlabored ventilation, respiratory function stable and patient connected to nasal cannula oxygen Cardiovascular status: blood pressure returned to baseline and stable Postop Assessment: no apparent nausea or vomiting Anesthetic complications: no   No notable events documented.  Last Vitals:  Vitals:   09/13/20 1000 09/13/20 1045  BP: 124/86 (!) 118/54  Pulse: 72 66  Resp: 16 16  Temp:    SpO2: 97% 95%    Last Pain:  Vitals:   09/13/20 1045  TempSrc:   PainSc: 0-No pain                 Barnet Glasgow

## 2020-09-13 NOTE — H&P (Signed)
Sherry Oconnor is an 87 y.o. female.   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  Past Medical History:  Diagnosis Date   Allergies    Arthritis    BRCA2 positive 07/17/2017   Pathogenic BRCA2 mutation [id number] ([id number]) @ [facility]   Carotid bruit    Right (Doppler done in 2/19   Diabetes mellitus    Diverticulosis    Family history of BRCA2 gene positive    BRCA2 mutation in daughter   Genetic testing 07/17/2017   BRCA2 analysis @ [facility] - Familial pathogenic BRCA2 mutation detected   Hoarseness of voice    Chronic   Hypercholesteremia    Hypertension    Multiple thyroid nodules    Skin cancer    Head   Spasmodic dysphonia    multinodular goiter   Vitamin B12 deficiency    Vitamin D deficiency     Past Surgical History:  Procedure Laterality Date   ABDOMINAL HYSTERECTOMY     BIOPSY THYROID     CHOLECYSTECTOMY     COLONOSCOPY     excision of skin cancer     Head   GALLBLADDER SURGERY     HERNIA REPAIR Right    LIPOMA EXCISION     OOPHORECTOMY     VENTRAL HERNIA REPAIR Right 01/20/2019   Procedure: LAPAROSCOPIC RIGHT SPIGELAIN HERNIA REPAIR WITH MESH;  Surgeon: Johnathan Hausen, MD;  Location: [facility];  Service: General;  Laterality: Right;    Family History  Problem Relation Age of Onset   Heart attack Mother    Heart disease Mother    Coronary artery disease Mother    Pneumonia Father    Hypertension Brother    CAD Brother    Colon cancer Brother    Peripheral Artery Disease Brother    Deep vein thrombosis Maternal Grandmother    Breast cancer Daughter 71       Negative BRCA1/BRCA2   Pancreatic cancer Daughter    Breast cancer Daughter 88       pancreatic ca at 40; BRCA2 mutation   Social History:  reports that she has never smoked. She has never used smokeless tobacco. She reports that she does not drink alcohol and does not  use drugs.  Allergies:  Allergies  Allergen Reactions   Darvon     Upset stomach    Septra [Bactrim] Nausea And Vomiting   Sulfa Drugs Cross Reactors Nausea And Vomiting   Zithromax [Azithromycin] Nausea And Vomiting   Codeine Nausea Only   Latex     sores   Naproxen Nausea Only   Tessalon Perles [Benzonatate] Diarrhea    Medications Prior to Admission  Medication Sig Dispense Refill   amLODipine (NORVASC) 2.5 MG tablet Take 2.5 mg by mouth daily.     lisinopril (ZESTRIL) 10 MG tablet Take 10 mg by mouth daily.     metFORMIN (GLUCOPHAGE-XR) 750 MG 24 hr tablet Take 1,500 mg by mouth daily with supper.      pravastatin (PRAVACHOL) 80 MG tablet Take 80 mg by mouth at bedtime.      vitamin B-12 (CYANOCOBALAMIN) 1000 MCG tablet Take 1,000 mcg by mouth daily.     Vitamin D, Ergocalciferol, (DRISDOL) 1.25 MG (50000 UT) CAPS capsule Take 50,000 Units by mouth every 14 (fourteen) days.     calcium carbonate (TUMS - DOSED IN MG ELEMENTAL CALCIUM) 500 MG chewable tablet Chew 500 mg by mouth daily  as needed for indigestion or heartburn.     fluticasone (FLONASE) 50 MCG/ACT nasal spray Place 2 sprays into the nose 2 (two) times daily as needed for allergies.      ibuprofen (ADVIL) 200 MG tablet Take 200 mg by mouth every 8 (eight) hours as needed for headache or moderate pain.      Results for orders placed or performed during the hospital encounter of 09/13/20 (from the past 48 hour(s))  Glucose, capillary     Status: Abnormal   Collection Time: 09/13/20  5:56 AM  Result Value Ref Range   Glucose-Capillary 137 (H) 70 - 99 mg/dL    Comment: Glucose reference range applies only to samples taken after fasting for at least 8 hours.   Comment 1 Notify RN    Comment 2 Document in Chart    No results found.  Review of Systems  All other systems reviewed and are negative.  Blood pressure (!) 137/59, pulse 70, temperature 98.1 F (36.7 C), temperature source Oral, resp. rate 14, height 5' 5"   (1.651 m), weight 49.1 kg, SpO2 99 %. Physical Exam HENT:     Head: Atraumatic.  Eyes:     Extraocular Movements: Extraocular movements intact.  Cardiovascular:     Pulses: Normal pulses.  Pulmonary:     Effort: Pulmonary effort is normal.  Musculoskeletal:     Comments: L shoulder pain with limited ROM> NVID  Skin:    General: Skin is warm.  Neurological:     Mental Status: She is alert.  Psychiatric:        Mood and Affect: Mood normal.     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 Plan L reverse TSA Risks / benefits of surgery discussed Consent on chart  NPO for OR Preop antibiotics   Isabella Stalling, MD 09/13/2020, 7:12 AM

## 2020-09-13 NOTE — Anesthesia Procedure Notes (Signed)
Procedure Name: Intubation Date/Time: 09/13/2020 7:35 AM Performed by: Gwyndolyn Saxon, CRNA Pre-anesthesia Checklist: Patient identified, Emergency Drugs available, Suction available and Patient being monitored Patient Re-evaluated:Patient Re-evaluated prior to induction Oxygen Delivery Method: Circle system utilized Preoxygenation: Pre-oxygenation with 100% oxygen Induction Type: IV induction Ventilation: Mask ventilation without difficulty Laryngoscope Size: Miller and 2 Grade View: Grade III Tube type: Oral Tube size: 6.5 mm Number of attempts: 1 Airway Equipment and Method: Patient positioned with wedge pillow and Stylet Placement Confirmation: positive ETCO2 and breath sounds checked- equal and bilateral Secured at: 21 cm Tube secured with: Tape Dental Injury: Teeth and Oropharynx as per pre-operative assessment

## 2020-09-13 NOTE — Discharge Instructions (Signed)

## 2020-09-18 ENCOUNTER — Encounter (HOSPITAL_COMMUNITY): Payer: Self-pay | Admitting: Orthopedic Surgery

## 2020-09-26 DIAGNOSIS — Z471 Aftercare following joint replacement surgery: Secondary | ICD-10-CM | POA: Diagnosis not present

## 2020-09-26 DIAGNOSIS — Z96612 Presence of left artificial shoulder joint: Secondary | ICD-10-CM | POA: Diagnosis not present

## 2020-10-04 DIAGNOSIS — E1169 Type 2 diabetes mellitus with other specified complication: Secondary | ICD-10-CM | POA: Diagnosis not present

## 2020-10-04 DIAGNOSIS — I10 Essential (primary) hypertension: Secondary | ICD-10-CM | POA: Diagnosis not present

## 2020-10-04 DIAGNOSIS — E785 Hyperlipidemia, unspecified: Secondary | ICD-10-CM | POA: Diagnosis not present

## 2020-10-24 DIAGNOSIS — Z96612 Presence of left artificial shoulder joint: Secondary | ICD-10-CM | POA: Diagnosis not present

## 2020-10-24 DIAGNOSIS — Z471 Aftercare following joint replacement surgery: Secondary | ICD-10-CM | POA: Diagnosis not present

## 2020-10-24 DIAGNOSIS — M25612 Stiffness of left shoulder, not elsewhere classified: Secondary | ICD-10-CM | POA: Diagnosis not present

## 2020-10-30 DIAGNOSIS — M25612 Stiffness of left shoulder, not elsewhere classified: Secondary | ICD-10-CM | POA: Diagnosis not present

## 2020-10-30 DIAGNOSIS — Z96612 Presence of left artificial shoulder joint: Secondary | ICD-10-CM | POA: Diagnosis not present

## 2020-11-01 DIAGNOSIS — Z96612 Presence of left artificial shoulder joint: Secondary | ICD-10-CM | POA: Diagnosis not present

## 2020-11-01 DIAGNOSIS — M25612 Stiffness of left shoulder, not elsewhere classified: Secondary | ICD-10-CM | POA: Diagnosis not present

## 2020-11-07 DIAGNOSIS — Z96612 Presence of left artificial shoulder joint: Secondary | ICD-10-CM | POA: Diagnosis not present

## 2020-11-07 DIAGNOSIS — M25612 Stiffness of left shoulder, not elsewhere classified: Secondary | ICD-10-CM | POA: Diagnosis not present

## 2020-11-09 DIAGNOSIS — Z96612 Presence of left artificial shoulder joint: Secondary | ICD-10-CM | POA: Diagnosis not present

## 2020-11-09 DIAGNOSIS — M25612 Stiffness of left shoulder, not elsewhere classified: Secondary | ICD-10-CM | POA: Diagnosis not present

## 2020-11-13 DIAGNOSIS — Z96612 Presence of left artificial shoulder joint: Secondary | ICD-10-CM | POA: Diagnosis not present

## 2020-11-13 DIAGNOSIS — M25612 Stiffness of left shoulder, not elsewhere classified: Secondary | ICD-10-CM | POA: Diagnosis not present

## 2020-11-15 DIAGNOSIS — M25612 Stiffness of left shoulder, not elsewhere classified: Secondary | ICD-10-CM | POA: Diagnosis not present

## 2020-11-15 DIAGNOSIS — Z96612 Presence of left artificial shoulder joint: Secondary | ICD-10-CM | POA: Diagnosis not present

## 2020-11-20 ENCOUNTER — Ambulatory Visit
Admission: RE | Admit: 2020-11-20 | Discharge: 2020-11-20 | Disposition: A | Discharge status: Home / Self Care | Payer: Medicare Other | Source: Ambulatory Visit | Attending: Family Medicine | Admitting: Family Medicine

## 2020-11-20 ENCOUNTER — Other Ambulatory Visit: Payer: Self-pay

## 2020-11-20 ENCOUNTER — Other Ambulatory Visit: Payer: Self-pay | Admitting: Family Medicine

## 2020-11-20 DIAGNOSIS — R911 Solitary pulmonary nodule: Secondary | ICD-10-CM

## 2020-11-20 DIAGNOSIS — M25612 Stiffness of left shoulder, not elsewhere classified: Secondary | ICD-10-CM | POA: Diagnosis not present

## 2020-11-20 DIAGNOSIS — Z96612 Presence of left artificial shoulder joint: Secondary | ICD-10-CM | POA: Diagnosis not present

## 2020-11-20 DIAGNOSIS — I700 Atherosclerosis of aorta: Secondary | ICD-10-CM | POA: Diagnosis not present

## 2020-11-21 ENCOUNTER — Other Ambulatory Visit: Payer: Self-pay | Admitting: Family Medicine

## 2020-11-21 DIAGNOSIS — R911 Solitary pulmonary nodule: Secondary | ICD-10-CM

## 2020-11-22 DIAGNOSIS — Z96612 Presence of left artificial shoulder joint: Secondary | ICD-10-CM | POA: Diagnosis not present

## 2020-11-22 DIAGNOSIS — M25612 Stiffness of left shoulder, not elsewhere classified: Secondary | ICD-10-CM | POA: Diagnosis not present

## 2020-11-27 DIAGNOSIS — Z96612 Presence of left artificial shoulder joint: Secondary | ICD-10-CM | POA: Diagnosis not present

## 2020-11-27 DIAGNOSIS — M25612 Stiffness of left shoulder, not elsewhere classified: Secondary | ICD-10-CM | POA: Diagnosis not present

## 2020-11-29 DIAGNOSIS — Z96612 Presence of left artificial shoulder joint: Secondary | ICD-10-CM | POA: Diagnosis not present

## 2020-11-29 DIAGNOSIS — M25612 Stiffness of left shoulder, not elsewhere classified: Secondary | ICD-10-CM | POA: Diagnosis not present

## 2020-12-03 DIAGNOSIS — Z96612 Presence of left artificial shoulder joint: Secondary | ICD-10-CM | POA: Diagnosis not present

## 2020-12-03 DIAGNOSIS — M25612 Stiffness of left shoulder, not elsewhere classified: Secondary | ICD-10-CM | POA: Diagnosis not present

## 2020-12-05 DIAGNOSIS — Z96612 Presence of left artificial shoulder joint: Secondary | ICD-10-CM | POA: Diagnosis not present

## 2020-12-05 DIAGNOSIS — M25612 Stiffness of left shoulder, not elsewhere classified: Secondary | ICD-10-CM | POA: Diagnosis not present

## 2020-12-11 ENCOUNTER — Other Ambulatory Visit: Payer: Self-pay

## 2020-12-11 ENCOUNTER — Ambulatory Visit
Admission: RE | Admit: 2020-12-11 | Discharge: 2020-12-11 | Disposition: A | Discharge status: Home / Self Care | Payer: Medicare Other | Source: Ambulatory Visit | Attending: Family Medicine | Admitting: Family Medicine

## 2020-12-11 DIAGNOSIS — J9859 Other diseases of mediastinum, not elsewhere classified: Secondary | ICD-10-CM | POA: Diagnosis not present

## 2020-12-11 DIAGNOSIS — R911 Solitary pulmonary nodule: Secondary | ICD-10-CM

## 2020-12-11 DIAGNOSIS — M2578 Osteophyte, vertebrae: Secondary | ICD-10-CM | POA: Diagnosis not present

## 2020-12-11 DIAGNOSIS — I700 Atherosclerosis of aorta: Secondary | ICD-10-CM | POA: Diagnosis not present

## 2020-12-11 MED ORDER — IOPAMIDOL (ISOVUE-300) INJECTION 61%
75.0000 mL | Freq: Once | INTRAVENOUS | Status: AC | PRN
  Administered 2020-12-11: 75 mL via INTRAVENOUS

## 2020-12-13 ENCOUNTER — Other Ambulatory Visit: Payer: Self-pay | Admitting: Family Medicine

## 2020-12-13 ENCOUNTER — Other Ambulatory Visit (HOSPITAL_COMMUNITY): Payer: Self-pay | Admitting: Family Medicine

## 2020-12-13 DIAGNOSIS — R911 Solitary pulmonary nodule: Secondary | ICD-10-CM

## 2020-12-21 DIAGNOSIS — R0981 Nasal congestion: Secondary | ICD-10-CM | POA: Diagnosis not present

## 2020-12-21 DIAGNOSIS — R911 Solitary pulmonary nodule: Secondary | ICD-10-CM | POA: Diagnosis not present

## 2020-12-21 DIAGNOSIS — Z7984 Long term (current) use of oral hypoglycemic drugs: Secondary | ICD-10-CM | POA: Diagnosis not present

## 2020-12-21 DIAGNOSIS — I10 Essential (primary) hypertension: Secondary | ICD-10-CM | POA: Diagnosis not present

## 2020-12-21 DIAGNOSIS — E1169 Type 2 diabetes mellitus with other specified complication: Secondary | ICD-10-CM | POA: Diagnosis not present

## 2020-12-21 DIAGNOSIS — Z23 Encounter for immunization: Secondary | ICD-10-CM | POA: Diagnosis not present

## 2020-12-21 DIAGNOSIS — E785 Hyperlipidemia, unspecified: Secondary | ICD-10-CM | POA: Diagnosis not present

## 2020-12-21 DIAGNOSIS — R202 Paresthesia of skin: Secondary | ICD-10-CM | POA: Diagnosis not present

## 2020-12-31 ENCOUNTER — Other Ambulatory Visit: Payer: Self-pay

## 2020-12-31 ENCOUNTER — Ambulatory Visit (HOSPITAL_COMMUNITY)
Admission: RE | Admit: 2020-12-31 | Discharge: 2020-12-31 | Disposition: A | Discharge status: Home / Self Care | Payer: Medicare Other | Source: Ambulatory Visit | Attending: Family Medicine | Admitting: Family Medicine

## 2020-12-31 DIAGNOSIS — I2510 Atherosclerotic heart disease of native coronary artery without angina pectoris: Secondary | ICD-10-CM | POA: Diagnosis not present

## 2020-12-31 DIAGNOSIS — M419 Scoliosis, unspecified: Secondary | ICD-10-CM | POA: Diagnosis not present

## 2020-12-31 DIAGNOSIS — R911 Solitary pulmonary nodule: Secondary | ICD-10-CM | POA: Diagnosis not present

## 2020-12-31 DIAGNOSIS — K5730 Diverticulosis of large intestine without perforation or abscess without bleeding: Secondary | ICD-10-CM | POA: Diagnosis not present

## 2020-12-31 DIAGNOSIS — E041 Nontoxic single thyroid nodule: Secondary | ICD-10-CM | POA: Diagnosis not present

## 2020-12-31 LAB — GLUCOSE, CAPILLARY: Glucose-Capillary: 120 mg/dL — ABNORMAL HIGH (ref 70–99)

## 2020-12-31 MED ORDER — FLUDEOXYGLUCOSE F - 18 (FDG) INJECTION
5.3700 | Freq: Once | INTRAVENOUS | Status: AC | PRN
  Administered 2020-12-31: 5.37 via INTRAVENOUS

## 2021-01-02 DIAGNOSIS — Z20822 Contact with and (suspected) exposure to covid-19: Secondary | ICD-10-CM | POA: Diagnosis not present

## 2021-01-09 DIAGNOSIS — Z23 Encounter for immunization: Secondary | ICD-10-CM | POA: Diagnosis not present

## 2021-03-15 DIAGNOSIS — M25512 Pain in left shoulder: Secondary | ICD-10-CM | POA: Diagnosis not present

## 2021-04-12 DIAGNOSIS — U071 COVID-19: Secondary | ICD-10-CM | POA: Diagnosis not present

## 2021-05-10 DIAGNOSIS — H524 Presbyopia: Secondary | ICD-10-CM | POA: Diagnosis not present

## 2021-05-10 DIAGNOSIS — E119 Type 2 diabetes mellitus without complications: Secondary | ICD-10-CM | POA: Diagnosis not present

## 2021-05-10 DIAGNOSIS — H5203 Hypermetropia, bilateral: Secondary | ICD-10-CM | POA: Diagnosis not present

## 2021-05-10 DIAGNOSIS — H2513 Age-related nuclear cataract, bilateral: Secondary | ICD-10-CM | POA: Diagnosis not present

## 2021-06-26 ENCOUNTER — Other Ambulatory Visit: Payer: Self-pay | Admitting: Family Medicine

## 2021-06-26 DIAGNOSIS — I10 Essential (primary) hypertension: Secondary | ICD-10-CM | POA: Diagnosis not present

## 2021-06-26 DIAGNOSIS — Z23 Encounter for immunization: Secondary | ICD-10-CM | POA: Diagnosis not present

## 2021-06-26 DIAGNOSIS — I447 Left bundle-branch block, unspecified: Secondary | ICD-10-CM | POA: Diagnosis not present

## 2021-06-26 DIAGNOSIS — Z0000 Encounter for general adult medical examination without abnormal findings: Secondary | ICD-10-CM | POA: Diagnosis not present

## 2021-06-26 DIAGNOSIS — R911 Solitary pulmonary nodule: Secondary | ICD-10-CM

## 2021-06-26 DIAGNOSIS — E538 Deficiency of other specified B group vitamins: Secondary | ICD-10-CM | POA: Diagnosis not present

## 2021-06-26 DIAGNOSIS — E559 Vitamin D deficiency, unspecified: Secondary | ICD-10-CM | POA: Diagnosis not present

## 2021-06-26 DIAGNOSIS — E785 Hyperlipidemia, unspecified: Secondary | ICD-10-CM | POA: Diagnosis not present

## 2021-06-26 DIAGNOSIS — E041 Nontoxic single thyroid nodule: Secondary | ICD-10-CM | POA: Diagnosis not present

## 2021-06-26 DIAGNOSIS — E1169 Type 2 diabetes mellitus with other specified complication: Secondary | ICD-10-CM | POA: Diagnosis not present

## 2021-06-27 ENCOUNTER — Other Ambulatory Visit: Payer: Self-pay | Admitting: Surgery

## 2021-06-27 DIAGNOSIS — E041 Nontoxic single thyroid nodule: Secondary | ICD-10-CM

## 2021-07-30 ENCOUNTER — Ambulatory Visit
Admission: RE | Admit: 2021-07-30 | Discharge: 2021-07-30 | Disposition: A | Discharge status: Home / Self Care | Payer: Medicare Other | Source: Ambulatory Visit | Attending: Family Medicine | Admitting: Family Medicine

## 2021-07-30 ENCOUNTER — Ambulatory Visit
Admission: RE | Admit: 2021-07-30 | Discharge: 2021-07-30 | Disposition: A | Discharge status: Home / Self Care | Payer: Medicare Other | Source: Ambulatory Visit | Attending: Surgery | Admitting: Surgery

## 2021-07-30 DIAGNOSIS — R911 Solitary pulmonary nodule: Secondary | ICD-10-CM

## 2021-07-30 DIAGNOSIS — E041 Nontoxic single thyroid nodule: Secondary | ICD-10-CM | POA: Diagnosis not present

## 2021-08-12 DIAGNOSIS — E119 Type 2 diabetes mellitus without complications: Secondary | ICD-10-CM | POA: Diagnosis not present

## 2021-08-12 DIAGNOSIS — E785 Hyperlipidemia, unspecified: Secondary | ICD-10-CM | POA: Diagnosis not present

## 2021-08-12 DIAGNOSIS — I10 Essential (primary) hypertension: Secondary | ICD-10-CM | POA: Diagnosis not present

## 2021-08-21 DIAGNOSIS — E042 Nontoxic multinodular goiter: Secondary | ICD-10-CM | POA: Diagnosis not present

## 2021-09-18 DIAGNOSIS — M67811 Other specified disorders of synovium, right shoulder: Secondary | ICD-10-CM | POA: Diagnosis not present

## 2021-09-18 DIAGNOSIS — Z96612 Presence of left artificial shoulder joint: Secondary | ICD-10-CM | POA: Diagnosis not present

## 2021-10-08 DIAGNOSIS — U071 COVID-19: Secondary | ICD-10-CM | POA: Diagnosis not present

## 2021-11-18 DIAGNOSIS — I10 Essential (primary) hypertension: Secondary | ICD-10-CM | POA: Diagnosis not present

## 2021-11-18 DIAGNOSIS — E785 Hyperlipidemia, unspecified: Secondary | ICD-10-CM | POA: Diagnosis not present

## 2021-11-18 DIAGNOSIS — E1140 Type 2 diabetes mellitus with diabetic neuropathy, unspecified: Secondary | ICD-10-CM | POA: Diagnosis not present

## 2021-11-19 DIAGNOSIS — D1801 Hemangioma of skin and subcutaneous tissue: Secondary | ICD-10-CM | POA: Diagnosis not present

## 2021-11-19 DIAGNOSIS — L579 Skin changes due to chronic exposure to nonionizing radiation, unspecified: Secondary | ICD-10-CM | POA: Diagnosis not present

## 2021-11-19 DIAGNOSIS — Z85828 Personal history of other malignant neoplasm of skin: Secondary | ICD-10-CM | POA: Diagnosis not present

## 2021-11-19 DIAGNOSIS — L570 Actinic keratosis: Secondary | ICD-10-CM | POA: Diagnosis not present

## 2021-11-19 DIAGNOSIS — L814 Other melanin hyperpigmentation: Secondary | ICD-10-CM | POA: Diagnosis not present

## 2021-11-19 DIAGNOSIS — L821 Other seborrheic keratosis: Secondary | ICD-10-CM | POA: Diagnosis not present

## 2021-11-27 DIAGNOSIS — Z23 Encounter for immunization: Secondary | ICD-10-CM | POA: Diagnosis not present

## 2021-12-17 DIAGNOSIS — L579 Skin changes due to chronic exposure to nonionizing radiation, unspecified: Secondary | ICD-10-CM | POA: Diagnosis not present

## 2021-12-17 DIAGNOSIS — Z85528 Personal history of other malignant neoplasm of kidney: Secondary | ICD-10-CM | POA: Diagnosis not present

## 2021-12-17 DIAGNOSIS — L570 Actinic keratosis: Secondary | ICD-10-CM | POA: Diagnosis not present

## 2021-12-17 DIAGNOSIS — D1801 Hemangioma of skin and subcutaneous tissue: Secondary | ICD-10-CM | POA: Diagnosis not present

## 2021-12-17 DIAGNOSIS — L821 Other seborrheic keratosis: Secondary | ICD-10-CM | POA: Diagnosis not present

## 2021-12-17 DIAGNOSIS — L814 Other melanin hyperpigmentation: Secondary | ICD-10-CM | POA: Diagnosis not present

## 2021-12-23 DIAGNOSIS — R222 Localized swelling, mass and lump, trunk: Secondary | ICD-10-CM | POA: Diagnosis not present

## 2021-12-23 DIAGNOSIS — R10811 Right upper quadrant abdominal tenderness: Secondary | ICD-10-CM | POA: Diagnosis not present

## 2021-12-26 DIAGNOSIS — E1169 Type 2 diabetes mellitus with other specified complication: Secondary | ICD-10-CM | POA: Diagnosis not present

## 2021-12-26 DIAGNOSIS — M545 Low back pain, unspecified: Secondary | ICD-10-CM | POA: Diagnosis not present

## 2021-12-26 DIAGNOSIS — F4321 Adjustment disorder with depressed mood: Secondary | ICD-10-CM | POA: Diagnosis not present

## 2021-12-26 DIAGNOSIS — R194 Change in bowel habit: Secondary | ICD-10-CM | POA: Diagnosis not present

## 2021-12-26 DIAGNOSIS — I10 Essential (primary) hypertension: Secondary | ICD-10-CM | POA: Diagnosis not present

## 2021-12-26 DIAGNOSIS — E049 Nontoxic goiter, unspecified: Secondary | ICD-10-CM | POA: Diagnosis not present

## 2021-12-26 DIAGNOSIS — R1310 Dysphagia, unspecified: Secondary | ICD-10-CM | POA: Diagnosis not present

## 2021-12-26 DIAGNOSIS — R1031 Right lower quadrant pain: Secondary | ICD-10-CM | POA: Diagnosis not present

## 2021-12-26 DIAGNOSIS — E785 Hyperlipidemia, unspecified: Secondary | ICD-10-CM | POA: Diagnosis not present

## 2021-12-27 ENCOUNTER — Other Ambulatory Visit: Payer: Self-pay | Admitting: Family Medicine

## 2021-12-27 DIAGNOSIS — R1031 Right lower quadrant pain: Secondary | ICD-10-CM

## 2022-01-01 DIAGNOSIS — Z23 Encounter for immunization: Secondary | ICD-10-CM | POA: Diagnosis not present

## 2022-01-03 ENCOUNTER — Ambulatory Visit
Admission: RE | Admit: 2022-01-03 | Discharge: 2022-01-03 | Disposition: A | Discharge status: Home / Self Care | Payer: Medicare Other | Source: Ambulatory Visit | Attending: Family Medicine | Admitting: Family Medicine

## 2022-01-03 DIAGNOSIS — R1031 Right lower quadrant pain: Secondary | ICD-10-CM

## 2022-01-03 DIAGNOSIS — K4020 Bilateral inguinal hernia, without obstruction or gangrene, not specified as recurrent: Secondary | ICD-10-CM | POA: Diagnosis not present

## 2022-01-03 DIAGNOSIS — I700 Atherosclerosis of aorta: Secondary | ICD-10-CM | POA: Diagnosis not present

## 2022-01-03 DIAGNOSIS — K5730 Diverticulosis of large intestine without perforation or abscess without bleeding: Secondary | ICD-10-CM | POA: Diagnosis not present

## 2022-01-03 DIAGNOSIS — K449 Diaphragmatic hernia without obstruction or gangrene: Secondary | ICD-10-CM | POA: Diagnosis not present

## 2022-01-03 MED ORDER — IOPAMIDOL (ISOVUE-300) INJECTION 61%
100.0000 mL | Freq: Once | INTRAVENOUS | Status: AC | PRN
  Administered 2022-01-03: 100 mL via INTRAVENOUS

## 2022-01-06 DIAGNOSIS — M62562 Muscle wasting and atrophy, not elsewhere classified, left lower leg: Secondary | ICD-10-CM | POA: Diagnosis not present

## 2022-01-06 DIAGNOSIS — M5459 Other low back pain: Secondary | ICD-10-CM | POA: Diagnosis not present

## 2022-01-06 DIAGNOSIS — R2689 Other abnormalities of gait and mobility: Secondary | ICD-10-CM | POA: Diagnosis not present

## 2022-01-06 DIAGNOSIS — M62561 Muscle wasting and atrophy, not elsewhere classified, right lower leg: Secondary | ICD-10-CM | POA: Diagnosis not present

## 2022-01-07 DIAGNOSIS — M62562 Muscle wasting and atrophy, not elsewhere classified, left lower leg: Secondary | ICD-10-CM | POA: Diagnosis not present

## 2022-01-07 DIAGNOSIS — M62561 Muscle wasting and atrophy, not elsewhere classified, right lower leg: Secondary | ICD-10-CM | POA: Diagnosis not present

## 2022-01-07 DIAGNOSIS — M5459 Other low back pain: Secondary | ICD-10-CM | POA: Diagnosis not present

## 2022-01-07 DIAGNOSIS — R2689 Other abnormalities of gait and mobility: Secondary | ICD-10-CM | POA: Diagnosis not present

## 2022-01-09 DIAGNOSIS — M62561 Muscle wasting and atrophy, not elsewhere classified, right lower leg: Secondary | ICD-10-CM | POA: Diagnosis not present

## 2022-01-09 DIAGNOSIS — M62562 Muscle wasting and atrophy, not elsewhere classified, left lower leg: Secondary | ICD-10-CM | POA: Diagnosis not present

## 2022-01-09 DIAGNOSIS — R2689 Other abnormalities of gait and mobility: Secondary | ICD-10-CM | POA: Diagnosis not present

## 2022-01-09 DIAGNOSIS — M5459 Other low back pain: Secondary | ICD-10-CM | POA: Diagnosis not present

## 2022-01-13 DIAGNOSIS — R2689 Other abnormalities of gait and mobility: Secondary | ICD-10-CM | POA: Diagnosis not present

## 2022-01-13 DIAGNOSIS — M62561 Muscle wasting and atrophy, not elsewhere classified, right lower leg: Secondary | ICD-10-CM | POA: Diagnosis not present

## 2022-01-13 DIAGNOSIS — M62562 Muscle wasting and atrophy, not elsewhere classified, left lower leg: Secondary | ICD-10-CM | POA: Diagnosis not present

## 2022-01-13 DIAGNOSIS — M5459 Other low back pain: Secondary | ICD-10-CM | POA: Diagnosis not present

## 2022-01-15 DIAGNOSIS — M62561 Muscle wasting and atrophy, not elsewhere classified, right lower leg: Secondary | ICD-10-CM | POA: Diagnosis not present

## 2022-01-15 DIAGNOSIS — R2689 Other abnormalities of gait and mobility: Secondary | ICD-10-CM | POA: Diagnosis not present

## 2022-01-15 DIAGNOSIS — M5459 Other low back pain: Secondary | ICD-10-CM | POA: Diagnosis not present

## 2022-01-15 DIAGNOSIS — M62562 Muscle wasting and atrophy, not elsewhere classified, left lower leg: Secondary | ICD-10-CM | POA: Diagnosis not present

## 2022-01-17 DIAGNOSIS — M5459 Other low back pain: Secondary | ICD-10-CM | POA: Diagnosis not present

## 2022-01-17 DIAGNOSIS — M62561 Muscle wasting and atrophy, not elsewhere classified, right lower leg: Secondary | ICD-10-CM | POA: Diagnosis not present

## 2022-01-17 DIAGNOSIS — R2689 Other abnormalities of gait and mobility: Secondary | ICD-10-CM | POA: Diagnosis not present

## 2022-01-17 DIAGNOSIS — M62562 Muscle wasting and atrophy, not elsewhere classified, left lower leg: Secondary | ICD-10-CM | POA: Diagnosis not present

## 2022-01-21 DIAGNOSIS — L821 Other seborrheic keratosis: Secondary | ICD-10-CM | POA: Diagnosis not present

## 2022-01-21 DIAGNOSIS — L814 Other melanin hyperpigmentation: Secondary | ICD-10-CM | POA: Diagnosis not present

## 2022-01-21 DIAGNOSIS — D492 Neoplasm of unspecified behavior of bone, soft tissue, and skin: Secondary | ICD-10-CM | POA: Diagnosis not present

## 2022-01-21 DIAGNOSIS — Z85528 Personal history of other malignant neoplasm of kidney: Secondary | ICD-10-CM | POA: Diagnosis not present

## 2022-01-21 DIAGNOSIS — L570 Actinic keratosis: Secondary | ICD-10-CM | POA: Diagnosis not present

## 2022-01-21 DIAGNOSIS — D1801 Hemangioma of skin and subcutaneous tissue: Secondary | ICD-10-CM | POA: Diagnosis not present

## 2022-01-21 DIAGNOSIS — L988 Other specified disorders of the skin and subcutaneous tissue: Secondary | ICD-10-CM | POA: Diagnosis not present

## 2022-01-27 DIAGNOSIS — M62561 Muscle wasting and atrophy, not elsewhere classified, right lower leg: Secondary | ICD-10-CM | POA: Diagnosis not present

## 2022-01-27 DIAGNOSIS — M62562 Muscle wasting and atrophy, not elsewhere classified, left lower leg: Secondary | ICD-10-CM | POA: Diagnosis not present

## 2022-01-27 DIAGNOSIS — R2689 Other abnormalities of gait and mobility: Secondary | ICD-10-CM | POA: Diagnosis not present

## 2022-01-27 DIAGNOSIS — M5459 Other low back pain: Secondary | ICD-10-CM | POA: Diagnosis not present

## 2022-01-29 DIAGNOSIS — M62561 Muscle wasting and atrophy, not elsewhere classified, right lower leg: Secondary | ICD-10-CM | POA: Diagnosis not present

## 2022-01-29 DIAGNOSIS — M62562 Muscle wasting and atrophy, not elsewhere classified, left lower leg: Secondary | ICD-10-CM | POA: Diagnosis not present

## 2022-01-29 DIAGNOSIS — R2689 Other abnormalities of gait and mobility: Secondary | ICD-10-CM | POA: Diagnosis not present

## 2022-01-29 DIAGNOSIS — M5459 Other low back pain: Secondary | ICD-10-CM | POA: Diagnosis not present

## 2022-01-31 DIAGNOSIS — R2689 Other abnormalities of gait and mobility: Secondary | ICD-10-CM | POA: Diagnosis not present

## 2022-01-31 DIAGNOSIS — M62561 Muscle wasting and atrophy, not elsewhere classified, right lower leg: Secondary | ICD-10-CM | POA: Diagnosis not present

## 2022-01-31 DIAGNOSIS — M5459 Other low back pain: Secondary | ICD-10-CM | POA: Diagnosis not present

## 2022-01-31 DIAGNOSIS — M62562 Muscle wasting and atrophy, not elsewhere classified, left lower leg: Secondary | ICD-10-CM | POA: Diagnosis not present

## 2022-02-03 DIAGNOSIS — M62561 Muscle wasting and atrophy, not elsewhere classified, right lower leg: Secondary | ICD-10-CM | POA: Diagnosis not present

## 2022-02-03 DIAGNOSIS — R2689 Other abnormalities of gait and mobility: Secondary | ICD-10-CM | POA: Diagnosis not present

## 2022-02-03 DIAGNOSIS — M5459 Other low back pain: Secondary | ICD-10-CM | POA: Diagnosis not present

## 2022-02-03 DIAGNOSIS — M62562 Muscle wasting and atrophy, not elsewhere classified, left lower leg: Secondary | ICD-10-CM | POA: Diagnosis not present

## 2022-02-04 DIAGNOSIS — M5459 Other low back pain: Secondary | ICD-10-CM | POA: Diagnosis not present

## 2022-02-04 DIAGNOSIS — R2689 Other abnormalities of gait and mobility: Secondary | ICD-10-CM | POA: Diagnosis not present

## 2022-02-04 DIAGNOSIS — M62562 Muscle wasting and atrophy, not elsewhere classified, left lower leg: Secondary | ICD-10-CM | POA: Diagnosis not present

## 2022-02-04 DIAGNOSIS — M62561 Muscle wasting and atrophy, not elsewhere classified, right lower leg: Secondary | ICD-10-CM | POA: Diagnosis not present

## 2022-02-05 DIAGNOSIS — R2689 Other abnormalities of gait and mobility: Secondary | ICD-10-CM | POA: Diagnosis not present

## 2022-02-05 DIAGNOSIS — M62561 Muscle wasting and atrophy, not elsewhere classified, right lower leg: Secondary | ICD-10-CM | POA: Diagnosis not present

## 2022-02-05 DIAGNOSIS — M62562 Muscle wasting and atrophy, not elsewhere classified, left lower leg: Secondary | ICD-10-CM | POA: Diagnosis not present

## 2022-02-05 DIAGNOSIS — M5459 Other low back pain: Secondary | ICD-10-CM | POA: Diagnosis not present

## 2022-02-07 DIAGNOSIS — R2689 Other abnormalities of gait and mobility: Secondary | ICD-10-CM | POA: Diagnosis not present

## 2022-02-07 DIAGNOSIS — M62561 Muscle wasting and atrophy, not elsewhere classified, right lower leg: Secondary | ICD-10-CM | POA: Diagnosis not present

## 2022-02-07 DIAGNOSIS — M62562 Muscle wasting and atrophy, not elsewhere classified, left lower leg: Secondary | ICD-10-CM | POA: Diagnosis not present

## 2022-02-07 DIAGNOSIS — M5459 Other low back pain: Secondary | ICD-10-CM | POA: Diagnosis not present

## 2022-02-10 DIAGNOSIS — R2689 Other abnormalities of gait and mobility: Secondary | ICD-10-CM | POA: Diagnosis not present

## 2022-02-10 DIAGNOSIS — M62562 Muscle wasting and atrophy, not elsewhere classified, left lower leg: Secondary | ICD-10-CM | POA: Diagnosis not present

## 2022-02-10 DIAGNOSIS — M5459 Other low back pain: Secondary | ICD-10-CM | POA: Diagnosis not present

## 2022-02-10 DIAGNOSIS — M62561 Muscle wasting and atrophy, not elsewhere classified, right lower leg: Secondary | ICD-10-CM | POA: Diagnosis not present

## 2022-02-12 DIAGNOSIS — M62561 Muscle wasting and atrophy, not elsewhere classified, right lower leg: Secondary | ICD-10-CM | POA: Diagnosis not present

## 2022-02-12 DIAGNOSIS — M62562 Muscle wasting and atrophy, not elsewhere classified, left lower leg: Secondary | ICD-10-CM | POA: Diagnosis not present

## 2022-02-12 DIAGNOSIS — R2689 Other abnormalities of gait and mobility: Secondary | ICD-10-CM | POA: Diagnosis not present

## 2022-02-12 DIAGNOSIS — M5459 Other low back pain: Secondary | ICD-10-CM | POA: Diagnosis not present

## 2022-02-14 DIAGNOSIS — R2689 Other abnormalities of gait and mobility: Secondary | ICD-10-CM | POA: Diagnosis not present

## 2022-02-14 DIAGNOSIS — M62561 Muscle wasting and atrophy, not elsewhere classified, right lower leg: Secondary | ICD-10-CM | POA: Diagnosis not present

## 2022-02-14 DIAGNOSIS — M5459 Other low back pain: Secondary | ICD-10-CM | POA: Diagnosis not present

## 2022-02-14 DIAGNOSIS — M62562 Muscle wasting and atrophy, not elsewhere classified, left lower leg: Secondary | ICD-10-CM | POA: Diagnosis not present

## 2022-02-18 DIAGNOSIS — L579 Skin changes due to chronic exposure to nonionizing radiation, unspecified: Secondary | ICD-10-CM | POA: Diagnosis not present

## 2022-02-18 DIAGNOSIS — L814 Other melanin hyperpigmentation: Secondary | ICD-10-CM | POA: Diagnosis not present

## 2022-02-18 DIAGNOSIS — M62562 Muscle wasting and atrophy, not elsewhere classified, left lower leg: Secondary | ICD-10-CM | POA: Diagnosis not present

## 2022-02-18 DIAGNOSIS — M62561 Muscle wasting and atrophy, not elsewhere classified, right lower leg: Secondary | ICD-10-CM | POA: Diagnosis not present

## 2022-02-18 DIAGNOSIS — R2689 Other abnormalities of gait and mobility: Secondary | ICD-10-CM | POA: Diagnosis not present

## 2022-02-18 DIAGNOSIS — L821 Other seborrheic keratosis: Secondary | ICD-10-CM | POA: Diagnosis not present

## 2022-02-18 DIAGNOSIS — L570 Actinic keratosis: Secondary | ICD-10-CM | POA: Diagnosis not present

## 2022-02-18 DIAGNOSIS — M5459 Other low back pain: Secondary | ICD-10-CM | POA: Diagnosis not present

## 2022-02-19 DIAGNOSIS — M5459 Other low back pain: Secondary | ICD-10-CM | POA: Diagnosis not present

## 2022-02-19 DIAGNOSIS — M62561 Muscle wasting and atrophy, not elsewhere classified, right lower leg: Secondary | ICD-10-CM | POA: Diagnosis not present

## 2022-02-19 DIAGNOSIS — M62562 Muscle wasting and atrophy, not elsewhere classified, left lower leg: Secondary | ICD-10-CM | POA: Diagnosis not present

## 2022-02-19 DIAGNOSIS — R2689 Other abnormalities of gait and mobility: Secondary | ICD-10-CM | POA: Diagnosis not present

## 2022-02-27 DIAGNOSIS — R2689 Other abnormalities of gait and mobility: Secondary | ICD-10-CM | POA: Diagnosis not present

## 2022-02-27 DIAGNOSIS — M5459 Other low back pain: Secondary | ICD-10-CM | POA: Diagnosis not present

## 2022-02-27 DIAGNOSIS — M62562 Muscle wasting and atrophy, not elsewhere classified, left lower leg: Secondary | ICD-10-CM | POA: Diagnosis not present

## 2022-02-27 DIAGNOSIS — M62561 Muscle wasting and atrophy, not elsewhere classified, right lower leg: Secondary | ICD-10-CM | POA: Diagnosis not present

## 2022-03-05 DIAGNOSIS — M5459 Other low back pain: Secondary | ICD-10-CM | POA: Diagnosis not present

## 2022-03-05 DIAGNOSIS — M62561 Muscle wasting and atrophy, not elsewhere classified, right lower leg: Secondary | ICD-10-CM | POA: Diagnosis not present

## 2022-03-05 DIAGNOSIS — R2689 Other abnormalities of gait and mobility: Secondary | ICD-10-CM | POA: Diagnosis not present

## 2022-03-05 DIAGNOSIS — M62562 Muscle wasting and atrophy, not elsewhere classified, left lower leg: Secondary | ICD-10-CM | POA: Diagnosis not present

## 2022-03-13 ENCOUNTER — Ambulatory Visit
Admission: RE | Admit: 2022-03-13 | Discharge: 2022-03-13 | Disposition: A | Discharge status: Home / Self Care | Payer: Medicare Other | Source: Ambulatory Visit | Attending: Family Medicine | Admitting: Family Medicine

## 2022-03-13 ENCOUNTER — Other Ambulatory Visit: Payer: Self-pay | Admitting: Family Medicine

## 2022-03-13 DIAGNOSIS — K5900 Constipation, unspecified: Secondary | ICD-10-CM | POA: Diagnosis not present

## 2022-03-13 DIAGNOSIS — K5909 Other constipation: Secondary | ICD-10-CM | POA: Diagnosis not present

## 2022-03-13 DIAGNOSIS — R1031 Right lower quadrant pain: Secondary | ICD-10-CM | POA: Diagnosis not present

## 2022-03-13 DIAGNOSIS — I10 Essential (primary) hypertension: Secondary | ICD-10-CM | POA: Diagnosis not present

## 2022-04-22 DIAGNOSIS — Z85828 Personal history of other malignant neoplasm of skin: Secondary | ICD-10-CM | POA: Diagnosis not present

## 2022-04-22 DIAGNOSIS — D229 Melanocytic nevi, unspecified: Secondary | ICD-10-CM | POA: Diagnosis not present

## 2022-04-22 DIAGNOSIS — L579 Skin changes due to chronic exposure to nonionizing radiation, unspecified: Secondary | ICD-10-CM | POA: Diagnosis not present

## 2022-04-22 DIAGNOSIS — L814 Other melanin hyperpigmentation: Secondary | ICD-10-CM | POA: Diagnosis not present

## 2022-04-22 DIAGNOSIS — L821 Other seborrheic keratosis: Secondary | ICD-10-CM | POA: Diagnosis not present

## 2022-04-22 DIAGNOSIS — L570 Actinic keratosis: Secondary | ICD-10-CM | POA: Diagnosis not present

## 2022-05-16 DIAGNOSIS — H52203 Unspecified astigmatism, bilateral: Secondary | ICD-10-CM | POA: Diagnosis not present

## 2022-05-16 DIAGNOSIS — H25813 Combined forms of age-related cataract, bilateral: Secondary | ICD-10-CM | POA: Diagnosis not present

## 2022-05-16 DIAGNOSIS — E119 Type 2 diabetes mellitus without complications: Secondary | ICD-10-CM | POA: Diagnosis not present

## 2022-05-28 DIAGNOSIS — L570 Actinic keratosis: Secondary | ICD-10-CM | POA: Diagnosis not present

## 2022-05-28 DIAGNOSIS — L988 Other specified disorders of the skin and subcutaneous tissue: Secondary | ICD-10-CM | POA: Diagnosis not present

## 2022-05-28 DIAGNOSIS — L905 Scar conditions and fibrosis of skin: Secondary | ICD-10-CM | POA: Diagnosis not present

## 2022-05-28 DIAGNOSIS — L578 Other skin changes due to chronic exposure to nonionizing radiation: Secondary | ICD-10-CM | POA: Diagnosis not present

## 2022-06-25 DIAGNOSIS — L821 Other seborrheic keratosis: Secondary | ICD-10-CM | POA: Diagnosis not present

## 2022-06-25 DIAGNOSIS — L988 Other specified disorders of the skin and subcutaneous tissue: Secondary | ICD-10-CM | POA: Diagnosis not present

## 2022-06-25 DIAGNOSIS — L814 Other melanin hyperpigmentation: Secondary | ICD-10-CM | POA: Diagnosis not present

## 2022-06-25 DIAGNOSIS — L570 Actinic keratosis: Secondary | ICD-10-CM | POA: Diagnosis not present

## 2022-07-03 ENCOUNTER — Other Ambulatory Visit: Payer: Self-pay | Admitting: Family Medicine

## 2022-07-03 DIAGNOSIS — R911 Solitary pulmonary nodule: Secondary | ICD-10-CM

## 2022-07-08 ENCOUNTER — Other Ambulatory Visit: Payer: Self-pay | Admitting: Family Medicine

## 2022-07-08 DIAGNOSIS — E1169 Type 2 diabetes mellitus with other specified complication: Secondary | ICD-10-CM | POA: Diagnosis not present

## 2022-07-08 DIAGNOSIS — F4321 Adjustment disorder with depressed mood: Secondary | ICD-10-CM | POA: Diagnosis not present

## 2022-07-08 DIAGNOSIS — E041 Nontoxic single thyroid nodule: Secondary | ICD-10-CM | POA: Diagnosis not present

## 2022-07-08 DIAGNOSIS — E049 Nontoxic goiter, unspecified: Secondary | ICD-10-CM | POA: Diagnosis not present

## 2022-07-08 DIAGNOSIS — R911 Solitary pulmonary nodule: Secondary | ICD-10-CM | POA: Diagnosis not present

## 2022-07-08 DIAGNOSIS — I10 Essential (primary) hypertension: Secondary | ICD-10-CM | POA: Diagnosis not present

## 2022-07-08 DIAGNOSIS — E785 Hyperlipidemia, unspecified: Secondary | ICD-10-CM | POA: Diagnosis not present

## 2022-07-08 DIAGNOSIS — I447 Left bundle-branch block, unspecified: Secondary | ICD-10-CM | POA: Diagnosis not present

## 2022-07-08 DIAGNOSIS — E538 Deficiency of other specified B group vitamins: Secondary | ICD-10-CM | POA: Diagnosis not present

## 2022-07-08 DIAGNOSIS — N289 Disorder of kidney and ureter, unspecified: Secondary | ICD-10-CM

## 2022-07-08 DIAGNOSIS — E2839 Other primary ovarian failure: Secondary | ICD-10-CM | POA: Diagnosis not present

## 2022-07-08 DIAGNOSIS — Z0000 Encounter for general adult medical examination without abnormal findings: Secondary | ICD-10-CM | POA: Diagnosis not present

## 2022-07-08 DIAGNOSIS — E559 Vitamin D deficiency, unspecified: Secondary | ICD-10-CM | POA: Diagnosis not present

## 2022-07-10 DIAGNOSIS — Z23 Encounter for immunization: Secondary | ICD-10-CM | POA: Diagnosis not present

## 2022-07-16 DIAGNOSIS — E119 Type 2 diabetes mellitus without complications: Secondary | ICD-10-CM | POA: Diagnosis not present

## 2022-07-16 DIAGNOSIS — M8588 Other specified disorders of bone density and structure, other site: Secondary | ICD-10-CM | POA: Diagnosis not present

## 2022-08-08 ENCOUNTER — Other Ambulatory Visit: Payer: Self-pay | Admitting: Surgery

## 2022-08-08 DIAGNOSIS — R062 Wheezing: Secondary | ICD-10-CM | POA: Diagnosis not present

## 2022-08-08 DIAGNOSIS — J019 Acute sinusitis, unspecified: Secondary | ICD-10-CM | POA: Diagnosis not present

## 2022-08-08 DIAGNOSIS — J209 Acute bronchitis, unspecified: Secondary | ICD-10-CM | POA: Diagnosis not present

## 2022-08-08 DIAGNOSIS — E042 Nontoxic multinodular goiter: Secondary | ICD-10-CM

## 2022-08-08 DIAGNOSIS — R058 Other specified cough: Secondary | ICD-10-CM | POA: Diagnosis not present

## 2022-08-08 DIAGNOSIS — R0981 Nasal congestion: Secondary | ICD-10-CM | POA: Diagnosis not present

## 2022-08-11 ENCOUNTER — Other Ambulatory Visit: Payer: Medicare Other

## 2022-08-12 ENCOUNTER — Other Ambulatory Visit: Payer: Medicare Other

## 2022-08-15 DIAGNOSIS — M79675 Pain in left toe(s): Secondary | ICD-10-CM | POA: Diagnosis not present

## 2022-08-15 DIAGNOSIS — E118 Type 2 diabetes mellitus with unspecified complications: Secondary | ICD-10-CM | POA: Diagnosis not present

## 2022-08-15 DIAGNOSIS — B351 Tinea unguium: Secondary | ICD-10-CM | POA: Diagnosis not present

## 2022-08-15 DIAGNOSIS — M2011 Hallux valgus (acquired), right foot: Secondary | ICD-10-CM | POA: Diagnosis not present

## 2022-08-19 ENCOUNTER — Ambulatory Visit
Admission: RE | Admit: 2022-08-19 | Discharge: 2022-08-19 | Disposition: A | Discharge status: Home / Self Care | Payer: Medicare Other | Source: Ambulatory Visit | Attending: Family Medicine | Admitting: Family Medicine

## 2022-08-19 DIAGNOSIS — N289 Disorder of kidney and ureter, unspecified: Secondary | ICD-10-CM

## 2022-08-19 DIAGNOSIS — N2889 Other specified disorders of kidney and ureter: Secondary | ICD-10-CM | POA: Diagnosis not present

## 2022-08-19 DIAGNOSIS — R911 Solitary pulmonary nodule: Secondary | ICD-10-CM

## 2022-08-19 DIAGNOSIS — I700 Atherosclerosis of aorta: Secondary | ICD-10-CM | POA: Diagnosis not present

## 2022-08-19 MED ORDER — IOPAMIDOL (ISOVUE-300) INJECTION 61%
100.0000 mL | Freq: Once | INTRAVENOUS | Status: AC | PRN
  Administered 2022-08-19: 100 mL via INTRAVENOUS

## 2022-08-26 ENCOUNTER — Ambulatory Visit
Admission: RE | Admit: 2022-08-26 | Discharge: 2022-08-26 | Disposition: A | Discharge status: Home / Self Care | Payer: Medicare Other | Source: Ambulatory Visit | Attending: Surgery | Admitting: Surgery

## 2022-08-26 DIAGNOSIS — E042 Nontoxic multinodular goiter: Secondary | ICD-10-CM

## 2022-09-02 DIAGNOSIS — D4102 Neoplasm of uncertain behavior of left kidney: Secondary | ICD-10-CM | POA: Diagnosis not present

## 2022-09-05 DIAGNOSIS — M67912 Unspecified disorder of synovium and tendon, left shoulder: Secondary | ICD-10-CM | POA: Diagnosis not present

## 2022-09-05 DIAGNOSIS — M67911 Unspecified disorder of synovium and tendon, right shoulder: Secondary | ICD-10-CM | POA: Diagnosis not present

## 2022-10-02 DIAGNOSIS — E042 Nontoxic multinodular goiter: Secondary | ICD-10-CM | POA: Diagnosis not present

## 2022-11-14 DIAGNOSIS — E118 Type 2 diabetes mellitus with unspecified complications: Secondary | ICD-10-CM | POA: Diagnosis not present

## 2022-11-14 DIAGNOSIS — M79675 Pain in left toe(s): Secondary | ICD-10-CM | POA: Diagnosis not present

## 2022-11-14 DIAGNOSIS — B351 Tinea unguium: Secondary | ICD-10-CM | POA: Diagnosis not present

## 2022-11-14 DIAGNOSIS — M2011 Hallux valgus (acquired), right foot: Secondary | ICD-10-CM | POA: Diagnosis not present

## 2022-11-25 DIAGNOSIS — B354 Tinea corporis: Secondary | ICD-10-CM | POA: Diagnosis not present

## 2022-11-25 DIAGNOSIS — L820 Inflamed seborrheic keratosis: Secondary | ICD-10-CM | POA: Diagnosis not present

## 2022-11-26 DIAGNOSIS — Z23 Encounter for immunization: Secondary | ICD-10-CM | POA: Diagnosis not present

## 2022-12-23 DIAGNOSIS — L905 Scar conditions and fibrosis of skin: Secondary | ICD-10-CM | POA: Diagnosis not present

## 2022-12-23 DIAGNOSIS — L814 Other melanin hyperpigmentation: Secondary | ICD-10-CM | POA: Diagnosis not present

## 2022-12-23 DIAGNOSIS — D1801 Hemangioma of skin and subcutaneous tissue: Secondary | ICD-10-CM | POA: Diagnosis not present

## 2022-12-23 DIAGNOSIS — L821 Other seborrheic keratosis: Secondary | ICD-10-CM | POA: Diagnosis not present

## 2022-12-23 DIAGNOSIS — B354 Tinea corporis: Secondary | ICD-10-CM | POA: Diagnosis not present

## 2023-01-09 DIAGNOSIS — I10 Essential (primary) hypertension: Secondary | ICD-10-CM | POA: Diagnosis not present

## 2023-01-09 DIAGNOSIS — E1142 Type 2 diabetes mellitus with diabetic polyneuropathy: Secondary | ICD-10-CM | POA: Diagnosis not present

## 2023-01-09 DIAGNOSIS — E1169 Type 2 diabetes mellitus with other specified complication: Secondary | ICD-10-CM | POA: Diagnosis not present

## 2023-01-09 DIAGNOSIS — E785 Hyperlipidemia, unspecified: Secondary | ICD-10-CM | POA: Diagnosis not present

## 2023-01-09 DIAGNOSIS — E559 Vitamin D deficiency, unspecified: Secondary | ICD-10-CM | POA: Diagnosis not present

## 2023-01-13 DIAGNOSIS — Z23 Encounter for immunization: Secondary | ICD-10-CM | POA: Diagnosis not present

## 2023-02-13 DIAGNOSIS — B351 Tinea unguium: Secondary | ICD-10-CM | POA: Diagnosis not present

## 2023-02-13 DIAGNOSIS — M2011 Hallux valgus (acquired), right foot: Secondary | ICD-10-CM | POA: Diagnosis not present

## 2023-02-13 DIAGNOSIS — M79675 Pain in left toe(s): Secondary | ICD-10-CM | POA: Diagnosis not present

## 2023-02-13 DIAGNOSIS — E119 Type 2 diabetes mellitus without complications: Secondary | ICD-10-CM | POA: Diagnosis not present

## 2023-05-12 DIAGNOSIS — E1142 Type 2 diabetes mellitus with diabetic polyneuropathy: Secondary | ICD-10-CM | POA: Diagnosis not present

## 2023-05-12 DIAGNOSIS — I10 Essential (primary) hypertension: Secondary | ICD-10-CM | POA: Diagnosis not present

## 2023-05-12 DIAGNOSIS — E1169 Type 2 diabetes mellitus with other specified complication: Secondary | ICD-10-CM | POA: Diagnosis not present

## 2023-05-15 DIAGNOSIS — L84 Corns and callosities: Secondary | ICD-10-CM | POA: Diagnosis not present

## 2023-05-15 DIAGNOSIS — B351 Tinea unguium: Secondary | ICD-10-CM | POA: Diagnosis not present

## 2023-05-15 DIAGNOSIS — E119 Type 2 diabetes mellitus without complications: Secondary | ICD-10-CM | POA: Diagnosis not present

## 2023-05-15 DIAGNOSIS — M79675 Pain in left toe(s): Secondary | ICD-10-CM | POA: Diagnosis not present

## 2023-05-17 ENCOUNTER — Emergency Department (HOSPITAL_BASED_OUTPATIENT_CLINIC_OR_DEPARTMENT_OTHER)
Admission: EM | Admit: 2023-05-17 | Discharge: 2023-05-17 | Disposition: A | Discharge status: Home / Self Care | Attending: Emergency Medicine | Admitting: Emergency Medicine

## 2023-05-17 ENCOUNTER — Emergency Department (HOSPITAL_BASED_OUTPATIENT_CLINIC_OR_DEPARTMENT_OTHER): Admitting: Radiology

## 2023-05-17 ENCOUNTER — Encounter (HOSPITAL_BASED_OUTPATIENT_CLINIC_OR_DEPARTMENT_OTHER): Payer: Self-pay | Admitting: Emergency Medicine

## 2023-05-17 DIAGNOSIS — R9389 Abnormal findings on diagnostic imaging of other specified body structures: Secondary | ICD-10-CM | POA: Diagnosis not present

## 2023-05-17 DIAGNOSIS — E119 Type 2 diabetes mellitus without complications: Secondary | ICD-10-CM | POA: Diagnosis not present

## 2023-05-17 DIAGNOSIS — Z91040 Latex allergy status: Secondary | ICD-10-CM | POA: Diagnosis not present

## 2023-05-17 DIAGNOSIS — I10 Essential (primary) hypertension: Secondary | ICD-10-CM | POA: Insufficient documentation

## 2023-05-17 DIAGNOSIS — Z7984 Long term (current) use of oral hypoglycemic drugs: Secondary | ICD-10-CM | POA: Insufficient documentation

## 2023-05-17 DIAGNOSIS — I209 Angina pectoris, unspecified: Secondary | ICD-10-CM | POA: Diagnosis not present

## 2023-05-17 DIAGNOSIS — Z79899 Other long term (current) drug therapy: Secondary | ICD-10-CM | POA: Diagnosis not present

## 2023-05-17 DIAGNOSIS — R079 Chest pain, unspecified: Secondary | ICD-10-CM | POA: Diagnosis not present

## 2023-05-17 DIAGNOSIS — Z96612 Presence of left artificial shoulder joint: Secondary | ICD-10-CM | POA: Diagnosis not present

## 2023-05-17 LAB — CBC
HCT: 45.1 % (ref 36.0–46.0)
Hemoglobin: 15 g/dL (ref 12.0–15.0)
MCH: 30.2 pg (ref 26.0–34.0)
MCHC: 33.3 g/dL (ref 30.0–36.0)
MCV: 90.9 fL (ref 80.0–100.0)
Platelets: 283 10*3/uL (ref 150–400)
RBC: 4.96 MIL/uL (ref 3.87–5.11)
RDW: 12.5 % (ref 11.5–15.5)
WBC: 8.3 10*3/uL (ref 4.0–10.5)
nRBC: 0 % (ref 0.0–0.2)

## 2023-05-17 LAB — BASIC METABOLIC PANEL
Anion gap: 12 (ref 5–15)
BUN: 21 mg/dL (ref 8–23)
CO2: 26 mmol/L (ref 22–32)
Calcium: 10.2 mg/dL (ref 8.9–10.3)
Chloride: 103 mmol/L (ref 98–111)
Creatinine, Ser: 0.86 mg/dL (ref 0.44–1.00)
GFR, Estimated: >60 mL/min (ref >60)
Glucose, Bld: 139 mg/dL — ABNORMAL HIGH (ref 70–99)
Potassium: 4.1 mmol/L (ref 3.5–5.1)
Sodium: 141 mmol/L (ref 135–145)

## 2023-05-17 LAB — TROPONIN I (HIGH SENSITIVITY)
Troponin I (High Sensitivity): 14 ng/L (ref <18)
Troponin I (High Sensitivity): 15 ng/L (ref <18)

## 2023-05-17 MED ORDER — SUCRALFATE 1 G PO TABS
1.0000 g | ORAL_TABLET | Freq: Once | ORAL | Status: AC
  Administered 2023-05-17: 1 g via ORAL
  Filled 2023-05-17: qty 1

## 2023-05-17 MED ORDER — ASPIRIN 81 MG PO CHEW
324.0000 mg | CHEWABLE_TABLET | Freq: Once | ORAL | Status: AC
  Administered 2023-05-17: 324 mg via ORAL
  Filled 2023-05-17: qty 4

## 2023-05-17 MED ORDER — NITROGLYCERIN 0.4 MG SL SUBL
0.4000 mg | SUBLINGUAL_TABLET | SUBLINGUAL | Status: DC | PRN
  Administered 2023-05-17 (×2): 0.4 mg via SUBLINGUAL

## 2023-05-17 MED ORDER — ACETAMINOPHEN 325 MG PO TABS
650.0000 mg | ORAL_TABLET | Freq: Once | ORAL | Status: AC
  Administered 2023-05-17: 650 mg via ORAL
  Filled 2023-05-17: qty 2

## 2023-05-17 MED ORDER — SUCRALFATE 1 G PO TABS: 1.0000 g | ORAL_TABLET | Freq: Three times a day (TID) | ORAL | 0 refills | Status: DC

## 2023-05-17 NOTE — ED Provider Notes (Signed)
 [place] EMERGENCY DEPARTMENT AT [facility] Provider Note   CSN: [id number] Arrival date & time: 05/17/23  1219     History  Chief Complaint  Patient presents with   Chest Pain    Sherry Oconnor is a 89 y.o. female.  HPI     89 year old patient with history of hypertension, diabetes, hyperlipidemia comes in with chief complaint of chest pain.  Patient was at church when she suddenly started having substernal chest pain described as pressure.  She had associated nausea.  Pain at its peak was 8 out of 10.  Pain currently down to 3 out of 10.  Patient has no associated shortness of breath, sweating, dizziness.  No history of similar pain recent exertional chest pain or shortness of breath.  No history of GERD.  Home Medications Prior to Admission medications   Medication Sig Start Date End Date Taking? Authorizing Provider  amLODipine (NORVASC) 2.5 MG tablet Take 2.5 mg by mouth daily.    [provider]  calcium carbonate (TUMS - DOSED IN MG ELEMENTAL CALCIUM) 500 MG chewable tablet Chew 500 mg by mouth daily as needed for indigestion or heartburn.    [provider]  fluticasone (FLONASE) 50 MCG/ACT nasal spray Place 2 sprays into the nose 2 (two) times daily as needed for allergies.     [provider]  HYDROcodone-acetaminophen (NORCO/VICODIN) 5-325 MG tablet Take 1-2 tablets by mouth every 6 (six) hours as needed for moderate pain. 09/13/20   Porterfield, Amber, PA-C  lisinopril (ZESTRIL) 10 MG tablet Take 10 mg by mouth daily.    [provider]  metFORMIN (GLUCOPHAGE-XR) 750 MG 24 hr tablet Take 1,500 mg by mouth daily with supper.     [provider]  ondansetron (ZOFRAN) 4 MG tablet Take 1 tablet (4 mg total) by mouth daily as needed for nausea or vomiting. 09/13/20   Porterfield, Amber, PA-C  pravastatin (PRAVACHOL) 80 MG tablet Take 80 mg by mouth at bedtime.     [provider]  tiZANidine (ZANAFLEX) 2 MG  tablet Take 1 tablet (2 mg total) by mouth every 8 (eight) hours as needed for muscle spasms. 09/13/20   Porterfield, Amber, PA-C  vitamin B-12 (CYANOCOBALAMIN) 1000 MCG tablet Take 1,000 mcg by mouth daily.    [provider]  Vitamin D, Ergocalciferol, (DRISDOL) 1.25 MG (50000 UT) CAPS capsule Take 50,000 Units by mouth every 14 (fourteen) days.    [provider]      Allergies    Darvon, Septra [bactrim], Sulfa drugs cross reactors, Zithromax [azithromycin], Codeine, Latex, Naproxen, and Tessalon perles [benzonatate]    Review of Systems   Review of Systems  All other systems reviewed and are negative.   Physical Exam Updated Vital Signs BP (!) 135/49   Pulse (!) 58   Temp 98.7 F (37.1 C) (Oral)   Resp 16   Ht 5\' 5"  (1.651 m)   Wt 49 kg   SpO2 90%   BMI 17.98 kg/m  Physical Exam Vitals and nursing note reviewed.  Constitutional:      Appearance: She is well-developed.  HENT:     Head: Atraumatic.  Cardiovascular:     Rate and Rhythm: Normal rate.     Heart sounds: Murmur heard.     Systolic murmur is present.  Pulmonary:     Effort: Pulmonary effort is normal.     Breath sounds: Normal breath sounds. No decreased breath sounds, wheezing, rhonchi or rales.  Musculoskeletal:  Cervical back: Normal range of motion and neck supple.  Skin:    General: Skin is warm and dry.  Neurological:     Mental Status: She is alert and oriented to person, place, and time.     ED Results / Procedures / Treatments   Labs (all labs ordered are listed, but only abnormal results are displayed) Labs Reviewed  BASIC METABOLIC PANEL - Abnormal; Notable for the following components:      Result Value   Glucose, Bld 139 (*)    All other components within normal limits  CBC  TROPONIN I (HIGH SENSITIVITY)  TROPONIN I (HIGH SENSITIVITY)    EKG EKG Interpretation Date/Time:  Sunday May 17 2023 12:26:24 EDT Ventricular Rate:  71 PR Interval:  234 QRS  Duration:  146 QT Interval:  420 QTC Calculation: 456 R Axis:   -64  Text Interpretation: Sinus rhythm with sinus arrhythmia with 1st degree A-V block Left axis deviation Non-specific intra-ventricular conduction block Minimal voltage criteria for LVH, may be normal variant ( Cornell product ) Inferior infarct , age undetermined Abnormal ECG When compared with ECG of 15-Aug-2020 11:55, Sinus rhythm has replaced Wide QRS rhythm Confirmed by Derwood Kaplan [phone]) on 05/17/2023 1:15:24 PM  Radiology DG Chest 2 View Result Date: 05/17/2023 CLINICAL DATA:  Chest pain. EXAM: CHEST - 2 VIEW COMPARISON:  11/20/2020. FINDINGS: Redemonstration of an oval approximately 1.4 x 2.5 cm opacity overlying the right lower lung zone, unchanged since multiple prior studies. Bilateral lung fields are otherwise clear. Bilateral costophrenic angles are clear. Note is made of elevated right hemidiaphragm. Stable cardio-mediastinal silhouette. No acute osseous abnormalities. Left reverse shoulder arthroplasty noted. The soft tissues are within normal limits. There are surgical clips in the right upper quadrant, typical of a previous cholecystectomy. IMPRESSION: No active cardiopulmonary disease. Electronically Signed   By: Jules Schick M.D.   On: 05/17/2023 12:40    Procedures Procedures    Medications Ordered in ED Medications  nitroGLYCERIN (NITROSTAT) SL tablet 0.4 mg (0.4 mg Sublingual Given 05/17/23 1338)  aspirin chewable tablet 324 mg (324 mg Oral Given 05/17/23 1329)  acetaminophen (TYLENOL) tablet 650 mg (650 mg Oral Given 05/17/23 1349)  sucralfate (CARAFATE) tablet 1 g (1 g Oral Given 05/17/23 1552)    ED Course/ Medical Decision Making/ A&P             HEART Score: 5                    Medical Decision Making Amount and/or Complexity of Data Reviewed Labs: ordered. Radiology: ordered.  Risk OTC drugs. Prescription drug management.   This patient presents to the ED with chief complaint(s) of  substernal chest pain, sudden onset with pertinent past medical history of hypertension, hyperlipidemia, diabetes.patient had a negative stress test about 2 or 3 years ago.  The complaint involves an extensive differential diagnosis and also carries with it a high risk of complications and morbidity.    The differential diagnosis considered for this patient includes  ACS syndrome Aortic dissection Aortic aneurysm Valvular disorder Myocarditis Pericarditis PE Pneumothorax Musculoskeletal pain PUD / Gastritis / Esophagitis Esophageal spasm  The initial plan is to get delta troponin, EKG and reassess the patient.  Will give her nitro   Additional history obtained: Additional history obtained from family Records reviewed  previous cardiology note, previous EKG that shows left bundle branch block.  We have also reviewed patient's stress test medications, medical history with PCP  Independent labs  interpretation:  The following labs were independently interpreted: Delta troponin is reassuring.  Basic labs are reassuring.  Independent visualization and interpretation of imaging: - I independently visualized the following imaging with scope of interpretation limited to determining acute life threatening conditions related to emergency care: X-ray of the chest, which revealed no evidence of pneumothorax.  Treatment and Reassessment: Patient reassessed.  Nitroglycerin did not help her with her chest pain.  She started having some headache.  We gave her some Tylenol.  On repeat assessment, she indicates that her chest pain is 1 out of 10 at most.  Patient's troponins are completely reassuring, she had reassuring stress test few years back, so pretest probability for ACS that needs admission is low.  I think patient can be seen by cardiology closely.  Other possibilities, especially in the setting of patient having loud murmur is aortic root dilation or valvular issue.   Consultation: -  Consulted or discussed management/test interpretation with external professional: Cardiology, Dr. Graciela Husbands.  Discussed with him patient's presenting symptoms, delta troponin being reassuring and EKG showing left bundle, but is not new.  He is also comfortable with patient getting outpatient follow-up in the setting of 2 troponin being undetectable, as long as patient is comfortable and the pretest probability is not extremely high for ACS.  I do not think my pretest probability is significantly high for ACS given the recent stress test that was normal and no episodes of chest pain and shortness of breath until today.  The patient appears reasonably screened and/or stabilized for discharge and I doubt any other medical condition or other [facility] requiring further screening, evaluation, or treatment in the ED at this time prior to discharge.   Results from the ER workup discussed with the patient face to face and all questions answered to the best of my ability. The patient is safe for discharge with strict return precautions.    Final Clinical Impression(s) / ED Diagnoses Final diagnoses:  Angina pectoris (HCC)    Rx / DC Orders ED Discharge Orders          Ordered    Ambulatory referral to Cardiology       Comments: If you have not heard from the Cardiology office within the next 72 hours please call [phone].   05/17/23 1554              Derwood Kaplan, MD 05/17/23 1559

## 2023-05-17 NOTE — Discharge Instructions (Addendum)
 We saw you in the ER for the chest pain/shortness of breath. All of our cardiac workup is normal, including labs, EKG and chest X-RAY are normal. We are not sure what is causing your discomfort, but we feel comfortable sending you home at this time. The workup in the ER is not complete, and you should follow up with your primary care doctor for further evaluation along with the cardiologist.  We have put in a referral for cardiology follow-up.  They should call you back within 48 hours with an appointment.  Please return to the ER if you have worsening chest pain, shortness of breath, pain radiating to your jaw, shoulder, or back, sweats or fainting. Otherwise see the Cardiologist or your primary care doctor as requested.

## 2023-05-17 NOTE — ED Notes (Signed)
 After two sl ntg pt. Rates her chest discomfort at a "4"; whereas before sl ntg she had rated it as a "3".

## 2023-05-17 NOTE — ED Triage Notes (Signed)
 C/o central CP starting approx 30 mins ago. Denies SHOB. No cardiac hx.

## 2023-05-17 NOTE — ED Notes (Signed)
 Ambulated pt about 100 feet, SpO2 94-96%, HR 86-94, no dizziness, no SOB, no CP experienced

## 2023-05-19 DIAGNOSIS — H353131 Nonexudative age-related macular degeneration, bilateral, early dry stage: Secondary | ICD-10-CM | POA: Diagnosis not present

## 2023-05-19 DIAGNOSIS — E119 Type 2 diabetes mellitus without complications: Secondary | ICD-10-CM | POA: Diagnosis not present

## 2023-05-19 DIAGNOSIS — H5203 Hypermetropia, bilateral: Secondary | ICD-10-CM | POA: Diagnosis not present

## 2023-05-19 DIAGNOSIS — H524 Presbyopia: Secondary | ICD-10-CM | POA: Diagnosis not present

## 2023-05-19 DIAGNOSIS — H25813 Combined forms of age-related cataract, bilateral: Secondary | ICD-10-CM | POA: Diagnosis not present

## 2023-05-21 DIAGNOSIS — I10 Essential (primary) hypertension: Secondary | ICD-10-CM | POA: Diagnosis not present

## 2023-05-21 DIAGNOSIS — E1142 Type 2 diabetes mellitus with diabetic polyneuropathy: Secondary | ICD-10-CM | POA: Diagnosis not present

## 2023-05-21 DIAGNOSIS — M62838 Other muscle spasm: Secondary | ICD-10-CM | POA: Diagnosis not present

## 2023-05-21 DIAGNOSIS — R0789 Other chest pain: Secondary | ICD-10-CM | POA: Diagnosis not present

## 2023-05-21 DIAGNOSIS — E785 Hyperlipidemia, unspecified: Secondary | ICD-10-CM | POA: Diagnosis not present

## 2023-06-01 DIAGNOSIS — E785 Hyperlipidemia, unspecified: Secondary | ICD-10-CM | POA: Diagnosis not present

## 2023-06-01 DIAGNOSIS — F4321 Adjustment disorder with depressed mood: Secondary | ICD-10-CM | POA: Diagnosis not present

## 2023-06-01 DIAGNOSIS — I10 Essential (primary) hypertension: Secondary | ICD-10-CM | POA: Diagnosis not present

## 2023-06-01 DIAGNOSIS — E1169 Type 2 diabetes mellitus with other specified complication: Secondary | ICD-10-CM | POA: Diagnosis not present

## 2023-06-01 DIAGNOSIS — E1142 Type 2 diabetes mellitus with diabetic polyneuropathy: Secondary | ICD-10-CM | POA: Diagnosis not present

## 2023-06-10 DIAGNOSIS — I10 Essential (primary) hypertension: Secondary | ICD-10-CM | POA: Diagnosis not present

## 2023-06-10 DIAGNOSIS — E1142 Type 2 diabetes mellitus with diabetic polyneuropathy: Secondary | ICD-10-CM | POA: Diagnosis not present

## 2023-06-10 DIAGNOSIS — E1169 Type 2 diabetes mellitus with other specified complication: Secondary | ICD-10-CM | POA: Diagnosis not present

## 2023-06-14 NOTE — Progress Notes (Unsigned)
 Cardiology Office Note:  .   Date:  06/16/2023  ID:  Sherry Oconnor, DOB 12-11-1931, MRN [id number] PCP: Victorio Grave, MD  [facility] Providers Cardiologist:  Ola Berger, MD   History of Present Illness: .   Sherry Oconnor is a 89 y.o. female with a past medical history of HTN, DM, HLD, goiter,breast cancer. Patient is followed by Dr. Avanell Bob, presents today for evaluation of chest pain   Patient was first referred to cardiology in 08/2020 for evaluation of abnormal EKG. Initially felt to be LBBB, but was actually first degree AV block and IVCD. EKG was overall nondiagnostic. Underwent nuclear stress test 08/2020 that was a normal, low risk study without evidence of ischemia. EF estimated at 77%.   Patient was seen in the ED on 05/17/23 for evaluation of chest pain. Chest pain started when she was at church, felt like pressure. Associated with nausea. hsTn negative x2. BMP and CBC grossly normal. CXR showed no active cardiopulmonary disease. EKG showed NSR, LBBB, possible LVH.   Today, patient presents for a follow-up visit after a recent ER visit for chest pain. ER visit took place on 3/16, about 1 month ago. She had been sitting in church when she started to develop chest pain. The pain was localized to the chest, described as if "somebody had hit me," and was associated with nausea. The pain did not radiate and was not associated with shortness of breath. In the ED, she was given ASA and nitroglycerin, and her symptoms resolved. The patient denies any recurrence of these symptoms since the ER visit.  The patient's left bundle branch block has been chronic and was noted on previous EKGs. The patient's cholesterol and blood pressure have been well controlled on medication. The patient is active, participating in exercise classes about four times a week and walking regularly, and reports no chest pain or shortness of breath during these activities. The patient also reports no issues with  dizziness, palpitations.  ROS: per HPI   Studies Reviewed: .   Cardiac Studies & Procedures   ______________________________________________________________________________________________   STRESS TESTS  MYOCARDIAL PERFUSION IMAGING 08/15/2020  Narrative  The left ventricular ejection fraction is hyperdynamic (>65%).  Nuclear stress EF: 77%.  There was no ST segment deviation noted during stress.  The study is normal.  This is a low risk study with no evidence of ischemia.            ______________________________________________________________________________________________      Risk Assessment/Calculations:             Physical Exam:   VS:  BP 138/70 (BP Location: Left Arm, Patient Position: Sitting, Cuff Size: Normal)   Pulse 71   Ht 5' (1.524 m)   Wt 112 lb 9.6 oz (51.1 kg)   SpO2 95%   BMI 21.99 kg/m    Wt Readings from Last 3 Encounters:  06/16/23 112 lb 9.6 oz (51.1 kg)  05/17/23 108 lb 0.4 oz (49 kg)  09/13/20 108 lb 3.9 oz (49.1 kg)    GEN: Thin, elderly female. Sitting upright in the chair in no acute distress  NECK: No JVD  CARDIAC:  RRR, no murmurs, rubs, gallops. Radial pulses 2+ bilaterally  RESPIRATORY:  Clear to auscultation without rales, wheezing or rhonchi. Normal WOB on room air  ABDOMEN: Soft, non-tender, non-distended EXTREMITIES:  No edema in BLE. No deformity   ASSESSMENT AND PLAN: .    Chest Pain  LBBB - Previously, nuclear stress test from 08/2020 was  a normal, low risk study  - Seen in the ED 3/16 with chest pain. Hstn negative x2. EKG with LBBB, which has been chronic. She had developed chest pain while at church, symptoms resolved with nitroglycerin and ASA. Has not had any recurrence in symptoms in the past month  - Patient takes exercise classes 4x per week. Also regularly walks around her living facility. Denies chest pain, shortness of breath on exertion  - Discussed that her ED workup was overall very reassuring with  negative troponin, stable EKG. Also discussed that it is very reassuring that her symptoms have not recurred and she is able to be active without angina  - Ordered echocardiogram  - Discussed coronary CTA, but I do not think this is necessary at this time as symptoms only occurred once, have not happened again, and workup has been reassuring. Will start with echo. If she has recurrence of chest pain, could consider further ischemic eval in the future  - Encouraged her to continue physical activity as tolerated  HTN  - BP is well controlled. Denies dizziness, syncope, near syncope  - Monitored closely by PCP- she checks her BP daily and her machine sends it to PCP for review. BP has been well controlled at home  - continue amlodipine 2.5 mg daily, lisinopril 10 mg daily  - K 4.1 and creatinine 0.86 on 3/16   HLD  - Lipid panel from 07/2022 showed LDL 90, HDL 88, triglycerides 97, total cholesterol 195  - Continue pravastatin 80 mg daily  - Followed by PCP    Dispo: Follow up in 3 months with APP   Signed, Debria Fang, PA-C

## 2023-06-15 ENCOUNTER — Ambulatory Visit: Admitting: Emergency Medicine

## 2023-06-16 ENCOUNTER — Encounter: Payer: Self-pay | Admitting: Cardiology

## 2023-06-16 ENCOUNTER — Ambulatory Visit: Attending: Cardiology | Admitting: Cardiology

## 2023-06-16 VITALS — BP 138/70 | HR 71 | Ht 60.0 in | Wt 112.6 lb

## 2023-06-16 DIAGNOSIS — I447 Left bundle-branch block, unspecified: Secondary | ICD-10-CM | POA: Insufficient documentation

## 2023-06-16 DIAGNOSIS — R079 Chest pain, unspecified: Secondary | ICD-10-CM | POA: Diagnosis not present

## 2023-06-16 DIAGNOSIS — E782 Mixed hyperlipidemia: Secondary | ICD-10-CM | POA: Diagnosis not present

## 2023-06-16 DIAGNOSIS — I10 Essential (primary) hypertension: Secondary | ICD-10-CM | POA: Insufficient documentation

## 2023-06-16 NOTE — Patient Instructions (Signed)
 Medication Instructions:  No changes *If you need a refill on your cardiac medications before your next appointment, please call your pharmacy*  Lab Work: No labs  Testing/Procedures: Your physician has requested that you have an echocardiogram. Echocardiography is a painless test that uses sound waves to create images of your heart. It provides your doctor with information about the size and shape of your heart and how well your heart's chambers and valves are working. This procedure takes approximately one hour. There are no restrictions for this procedure. Please do NOT wear cologne, perfume, aftershave, or lotions (deodorant is allowed). Please arrive 15 minutes prior to your appointment time.  Please note: We ask at that you not bring children with you during ultrasound (echo/ vascular) testing. Due to room size and safety concerns, children are not allowed in the ultrasound rooms during exams. Our front office staff cannot provide observation of children in our lobby area while testing is being conducted. An adult accompanying a patient to their appointment will only be allowed in the ultrasound room at the discretion of the ultrasound technician under special circumstances. We apologize for any inconvenience.  Follow-Up: At [facility], you and your health needs are our priority.  As part of our continuing mission to provide you with exceptional heart care, our providers are all part of one team.  This team includes your primary Cardiologist (physician) and Advanced Practice Providers or APPs (Physician Assistants and Nurse Practitioners) who all work together to provide you with the care you need, when you need it.  Your next appointment:   3 month(s)  Provider:   Liane Redman, PA-C  We recommend signing up for the patient portal called "MyChart".  Sign up information is provided on this After Visit Summary.  MyChart is used to connect with patients for Virtual Visits  (Telemedicine).  Patients are able to view lab/test results, encounter notes, upcoming appointments, etc.  Non-urgent messages can be sent to your provider as well.   To learn more about what you can do with MyChart, go to [email or web address].   Other Instructions:   1st Floor: - Lobby - Registration  - Pharmacy  - Lab - Cafe  2nd Floor: - PV Lab - Diagnostic Testing (echo, CT, nuclear med)  3rd Floor: - Vacant  4th Floor: - TCTS (cardiothoracic surgery) - AFib Clinic - Structural Heart Clinic - Vascular Surgery  - Vascular Ultrasound  5th Floor: - HeartCare Cardiology (general and EP) - Clinical Pharmacy for coumadin, hypertension, lipid, weight-loss medications, and med management appointments    Valet parking services will be available as well.

## 2023-07-01 DIAGNOSIS — F4321 Adjustment disorder with depressed mood: Secondary | ICD-10-CM | POA: Diagnosis not present

## 2023-07-01 DIAGNOSIS — E1142 Type 2 diabetes mellitus with diabetic polyneuropathy: Secondary | ICD-10-CM | POA: Diagnosis not present

## 2023-07-01 DIAGNOSIS — E1169 Type 2 diabetes mellitus with other specified complication: Secondary | ICD-10-CM | POA: Diagnosis not present

## 2023-07-01 DIAGNOSIS — I10 Essential (primary) hypertension: Secondary | ICD-10-CM | POA: Diagnosis not present

## 2023-07-01 DIAGNOSIS — E785 Hyperlipidemia, unspecified: Secondary | ICD-10-CM | POA: Diagnosis not present

## 2023-07-10 DIAGNOSIS — I10 Essential (primary) hypertension: Secondary | ICD-10-CM | POA: Diagnosis not present

## 2023-07-10 DIAGNOSIS — E1169 Type 2 diabetes mellitus with other specified complication: Secondary | ICD-10-CM | POA: Diagnosis not present

## 2023-07-10 DIAGNOSIS — E1142 Type 2 diabetes mellitus with diabetic polyneuropathy: Secondary | ICD-10-CM | POA: Diagnosis not present

## 2023-07-13 DIAGNOSIS — Z1331 Encounter for screening for depression: Secondary | ICD-10-CM | POA: Diagnosis not present

## 2023-07-13 DIAGNOSIS — I10 Essential (primary) hypertension: Secondary | ICD-10-CM | POA: Diagnosis not present

## 2023-07-13 DIAGNOSIS — R911 Solitary pulmonary nodule: Secondary | ICD-10-CM | POA: Diagnosis not present

## 2023-07-13 DIAGNOSIS — E049 Nontoxic goiter, unspecified: Secondary | ICD-10-CM | POA: Diagnosis not present

## 2023-07-13 DIAGNOSIS — K219 Gastro-esophageal reflux disease without esophagitis: Secondary | ICD-10-CM | POA: Diagnosis not present

## 2023-07-13 DIAGNOSIS — Z0000 Encounter for general adult medical examination without abnormal findings: Secondary | ICD-10-CM | POA: Diagnosis not present

## 2023-07-13 DIAGNOSIS — E785 Hyperlipidemia, unspecified: Secondary | ICD-10-CM | POA: Diagnosis not present

## 2023-07-13 DIAGNOSIS — E559 Vitamin D deficiency, unspecified: Secondary | ICD-10-CM | POA: Diagnosis not present

## 2023-07-13 DIAGNOSIS — Z23 Encounter for immunization: Secondary | ICD-10-CM | POA: Diagnosis not present

## 2023-07-13 DIAGNOSIS — E1142 Type 2 diabetes mellitus with diabetic polyneuropathy: Secondary | ICD-10-CM | POA: Diagnosis not present

## 2023-07-13 DIAGNOSIS — E538 Deficiency of other specified B group vitamins: Secondary | ICD-10-CM | POA: Diagnosis not present

## 2023-07-13 DIAGNOSIS — M79671 Pain in right foot: Secondary | ICD-10-CM | POA: Diagnosis not present

## 2023-07-20 ENCOUNTER — Ambulatory Visit (HOSPITAL_COMMUNITY)
Admission: RE | Admit: 2023-07-20 | Discharge: 2023-07-20 | Disposition: A | Discharge status: Home / Self Care | Source: Ambulatory Visit | Attending: Internal Medicine | Admitting: Internal Medicine

## 2023-07-20 ENCOUNTER — Ambulatory Visit: Payer: Self-pay | Admitting: Cardiology

## 2023-07-20 DIAGNOSIS — R079 Chest pain, unspecified: Secondary | ICD-10-CM | POA: Insufficient documentation

## 2023-07-20 DIAGNOSIS — I447 Left bundle-branch block, unspecified: Secondary | ICD-10-CM | POA: Diagnosis not present

## 2023-07-20 LAB — ECHOCARDIOGRAM COMPLETE
Area-P 1/2: 3.31 cm2
Est EF: 55
S' Lateral: 3.3 cm

## 2023-07-21 NOTE — Telephone Encounter (Signed)
-----   Message from Sherry Oconnor sent at 07/20/2023  2:10 PM EDT ----- Please tell patient that her echocardiogram showed EF normal at 55%.  There is abnormal septal motion, consistent with her chronic LBBB. No regional wall motion abnormalities. RV is normal in size and functions normally. Mild leakiness of the mitral valve.   Overall, good result  Thanks KJ

## 2023-07-21 NOTE — Telephone Encounter (Signed)
 Left message to call back

## 2023-07-22 ENCOUNTER — Telehealth: Payer: Self-pay | Admitting: Internal Medicine

## 2023-07-22 NOTE — Telephone Encounter (Signed)
 Called patient advised of below they verbalized understanding No questions at this time.

## 2023-07-22 NOTE — Telephone Encounter (Signed)
Patient is requesting a call back to discuss echo results. 

## 2023-07-22 NOTE — Telephone Encounter (Signed)
 Spoke with patient and she is aware of echo results she verbalized understanding

## 2023-07-23 ENCOUNTER — Ambulatory Visit (INDEPENDENT_AMBULATORY_CARE_PROVIDER_SITE_OTHER)

## 2023-07-23 ENCOUNTER — Other Ambulatory Visit: Payer: Self-pay | Admitting: Podiatry

## 2023-07-23 ENCOUNTER — Ambulatory Visit (INDEPENDENT_AMBULATORY_CARE_PROVIDER_SITE_OTHER): Admitting: Podiatry

## 2023-07-23 DIAGNOSIS — G5781 Other specified mononeuropathies of right lower limb: Secondary | ICD-10-CM

## 2023-07-23 DIAGNOSIS — M778 Other enthesopathies, not elsewhere classified: Secondary | ICD-10-CM | POA: Diagnosis not present

## 2023-07-23 MED ORDER — TRIAMCINOLONE ACETONIDE 40 MG/ML IJ SUSP
20.0000 mg | Freq: Once | INTRAMUSCULAR | Status: AC
  Administered 2023-07-23: 20 mg

## 2023-07-27 NOTE — Progress Notes (Signed)
 Subjective:  Patient ID: Sherry Oconnor, female    DOB: 1931/08/29,  MRN: [id number] HPI No chief complaint on file.   89 y.o. female presents with the above complaint.   ROS: Denies fever chills nausea by muscle aches pains calf pain back pain chest pain shortness of breath.  Past Medical History:  Diagnosis Date   Allergies    Arthritis    BRCA2 positive 07/17/2017   Pathogenic BRCA2 mutation [id number] ([id number]) @ [facility]   Carotid bruit    Right (Doppler done in 2/19   Diabetes mellitus    Diverticulosis    Family history of BRCA2 gene positive    BRCA2 mutation in daughter   Genetic testing 07/17/2017   BRCA2 analysis @ [facility] - Familial pathogenic BRCA2 mutation detected   Hoarseness of voice    Chronic   Hypercholesteremia    Hypertension    Multiple thyroid  nodules    Skin cancer    Head   Spasmodic dysphonia    multinodular goiter   Vitamin B12 deficiency    Vitamin D deficiency    Past Surgical History:  Procedure Laterality Date   ABDOMINAL HYSTERECTOMY     BIOPSY THYROID      CHOLECYSTECTOMY     COLONOSCOPY     excision of skin cancer     Head   GALLBLADDER SURGERY     HERNIA REPAIR Right    LIPOMA EXCISION     OOPHORECTOMY     REVERSE SHOULDER ARTHROPLASTY Left 09/13/2020   Procedure: REVERSE SHOULDER ARTHROPLASTY;  Surgeon: Sammye Cristal, MD;  Location: [facility];  Service: Orthopedics;  Laterality: Left;   VENTRAL HERNIA REPAIR Right 01/20/2019   Procedure: LAPAROSCOPIC RIGHT SPIGELAIN HERNIA REPAIR WITH MESH;  Surgeon: Jacolyn Matar, MD;  Location: [facility];  Service: General;  Laterality: Right;    Current Outpatient Medications:    amLODipine (NORVASC) 2.5 MG tablet, Take 2.5 mg by mouth daily., Disp: , Rfl:    B Complex Vitamins (VITAMIN B COMPLEX PO), Take by mouth daily., Disp: , Rfl:    calcium carbonate (TUMS - DOSED IN MG ELEMENTAL CALCIUM) 500 MG chewable tablet, Chew 500 mg by mouth daily as needed for indigestion or  heartburn., Disp: , Rfl:    fluticasone (FLONASE) 50 MCG/ACT nasal spray, Place 2 sprays into the nose 2 (two) times daily as needed for allergies. , Disp: , Rfl:    gabapentin (NEURONTIN) 300 MG capsule, Take 300 mg by mouth daily., Disp: , Rfl:    HYDROcodone -acetaminophen  (NORCO/VICODIN) 5-325 MG tablet, Take 1-2 tablets by mouth every 6 (six) hours as needed for moderate pain. (Patient not taking: Reported on 06/16/2023), Disp: 30 tablet, Rfl: 0   Lancets (ONETOUCH DELICA PLUS LANCET33G) MISC, as needed., Disp: , Rfl:    lisinopril (ZESTRIL) 10 MG tablet, Take 10 mg by mouth daily., Disp: , Rfl:    loratadine (CLARITIN) 10 MG tablet, Take 10 mg by mouth daily., Disp: , Rfl:    metFORMIN (GLUCOPHAGE-XR) 750 MG 24 hr tablet, Take 1,500 mg by mouth daily with supper. , Disp: , Rfl:    ondansetron  (ZOFRAN ) 4 MG tablet, Take 1 tablet (4 mg total) by mouth daily as needed for nausea or vomiting. (Patient not taking: Reported on 06/16/2023), Disp: 20 tablet, Rfl: 0   ONETOUCH ULTRA test strip, as needed., Disp: , Rfl:    pravastatin (PRAVACHOL) 80 MG tablet, Take 80 mg by mouth at bedtime. , Disp: , Rfl:    sucralfate  (CARAFATE ) 1 g tablet,  Take 1 tablet (1 g total) by mouth 4 (four) times daily -  with meals and at bedtime. (Patient not taking: Reported on 06/16/2023), Disp: 30 tablet, Rfl: 0   tiZANidine  (ZANAFLEX ) 2 MG tablet, Take 1 tablet (2 mg total) by mouth every 8 (eight) hours as needed for muscle spasms., Disp: 30 tablet, Rfl: 0   vitamin B-12 (CYANOCOBALAMIN ) 1000 MCG tablet, Take 1,000 mcg by mouth daily., Disp: , Rfl:    Vitamin D, Ergocalciferol, (DRISDOL) 1.25 MG (50000 UT) CAPS capsule, Take 50,000 Units by mouth every 14 (fourteen) days., Disp: , Rfl:   Allergies  Allergen Reactions   Darvon     Upset stomach    Septra [Bactrim] Nausea And Vomiting   Sulfa Drugs Cross Reactors Nausea And Vomiting   Zithromax [Azithromycin] Nausea And Vomiting   Codeine Nausea Only   Latex      sores   Naproxen Nausea Only   Tessalon Perles [Benzonatate] Diarrhea   Review of Systems Objective:  There were no vitals filed for this visit.  General: Well developed, nourished, in no acute distress, alert and oriented x3   Dermatological: Skin is warm, dry and supple bilateral. Nails x 10 are well maintained; remaining integument appears unremarkable at this time. There are no open sores, no preulcerative lesions, no rash or signs of infection present.  Vascular: Dorsalis Pedis artery and Posterior Tibial artery pedal pulses are 2/4 bilateral with immedate capillary fill time. Pedal hair growth present. No varicosities and no lower extremity edema present bilateral.   Neruologic: Grossly intact via light touch bilateral. Vibratory intact via tuning fork bilateral. Protective threshold with Semmes Wienstein monofilament intact to all pedal sites bilateral. Patellar and Achilles deep tendon reflexes 2+ bilateral. No Babinski or clonus noted bilateral.   Musculoskeletal: No gross boney pedal deformities bilateral. No pain, crepitus, or limitation noted with foot and ankle range of motion bilateral. Muscular strength 5/5 in all groups tested bilateral.  Moderate to severe hallux abductovalgus deformity bilateral right greater than left hallux valgus deformity hammertoe deformities tailor's bunion deformities palpable Mulder's click third interdigital space and fourth interdigital space of the right foot.  Gait: Unassisted, Nonantalgic.    Radiographs:  Radiographs taken today right foot demonstrate osseously mature individual with significant demineralization of the bone.  Foot demonstrates plantar and posterior heel spurs with midfoot osteoarthritic changes.  Severe hallux valgus deformity with dislocation of the first toe as well as hammertoe deformities and tailor's bunion deformities.  Assessment & Plan:   Assessment: Pain on palpation third interdigital space fourth interdigital  space of the right foot consistent with neuromas.  Plan: Injected the area today with Kenalog and local anesthetic.  Tolerated procedure well without complications.  This was performed after skin was cleaned with alcohol and a Band-Aid was placed.     Laval Cafaro T. [place], [place]

## 2023-08-01 DIAGNOSIS — I10 Essential (primary) hypertension: Secondary | ICD-10-CM | POA: Diagnosis not present

## 2023-08-01 DIAGNOSIS — E1169 Type 2 diabetes mellitus with other specified complication: Secondary | ICD-10-CM | POA: Diagnosis not present

## 2023-08-01 DIAGNOSIS — F4321 Adjustment disorder with depressed mood: Secondary | ICD-10-CM | POA: Diagnosis not present

## 2023-08-01 DIAGNOSIS — E785 Hyperlipidemia, unspecified: Secondary | ICD-10-CM | POA: Diagnosis not present

## 2023-08-01 DIAGNOSIS — E1142 Type 2 diabetes mellitus with diabetic polyneuropathy: Secondary | ICD-10-CM | POA: Diagnosis not present

## 2023-08-09 DIAGNOSIS — E1169 Type 2 diabetes mellitus with other specified complication: Secondary | ICD-10-CM | POA: Diagnosis not present

## 2023-08-09 DIAGNOSIS — E1142 Type 2 diabetes mellitus with diabetic polyneuropathy: Secondary | ICD-10-CM | POA: Diagnosis not present

## 2023-08-09 DIAGNOSIS — I10 Essential (primary) hypertension: Secondary | ICD-10-CM | POA: Diagnosis not present

## 2023-08-14 DIAGNOSIS — B351 Tinea unguium: Secondary | ICD-10-CM | POA: Diagnosis not present

## 2023-08-14 DIAGNOSIS — E119 Type 2 diabetes mellitus without complications: Secondary | ICD-10-CM | POA: Diagnosis not present

## 2023-08-14 DIAGNOSIS — M79675 Pain in left toe(s): Secondary | ICD-10-CM | POA: Diagnosis not present

## 2023-08-14 DIAGNOSIS — L84 Corns and callosities: Secondary | ICD-10-CM | POA: Diagnosis not present

## 2023-08-31 DIAGNOSIS — E1169 Type 2 diabetes mellitus with other specified complication: Secondary | ICD-10-CM | POA: Diagnosis not present

## 2023-08-31 DIAGNOSIS — F4321 Adjustment disorder with depressed mood: Secondary | ICD-10-CM | POA: Diagnosis not present

## 2023-08-31 DIAGNOSIS — E1142 Type 2 diabetes mellitus with diabetic polyneuropathy: Secondary | ICD-10-CM | POA: Diagnosis not present

## 2023-08-31 DIAGNOSIS — I10 Essential (primary) hypertension: Secondary | ICD-10-CM | POA: Diagnosis not present

## 2023-08-31 DIAGNOSIS — E785 Hyperlipidemia, unspecified: Secondary | ICD-10-CM | POA: Diagnosis not present

## 2023-09-08 DIAGNOSIS — I10 Essential (primary) hypertension: Secondary | ICD-10-CM | POA: Diagnosis not present

## 2023-09-08 DIAGNOSIS — E1169 Type 2 diabetes mellitus with other specified complication: Secondary | ICD-10-CM | POA: Diagnosis not present

## 2023-09-08 DIAGNOSIS — E1142 Type 2 diabetes mellitus with diabetic polyneuropathy: Secondary | ICD-10-CM | POA: Diagnosis not present

## 2023-09-11 NOTE — Progress Notes (Signed)
 Cardiology Office Note   Date:  09/25/2023  ID:  Maryela, Tapper 06-14-1931, MRN [id number] PCP: Teresa Channel, MD  [facility] Providers Cardiologist:  Vina Gull, MD    History of Present Illness Sherry Oconnor is a 89 y.o. female  with a past medical history of HTN, DM, HLD, goiter,breast cancer. Patient is followed by Dr. Gull, presents today for evaluation of chest pain    Patient was first referred to cardiology in 08/2020 for evaluation of abnormal EKG. Initially felt to be LBBB, but was actually first degree AV block and IVCD. EKG was overall nondiagnostic. Underwent nuclear stress test 08/2020 that was a normal, low risk study without evidence of ischemia. EF estimated at 77%.    Patient was seen in the ED on 05/17/23 for evaluation of chest pain. Chest pain started when she was at church, felt like pressure. Associated with nausea. hsTn negative x2. BMP and CBC grossly normal. CXR showed no active cardiopulmonary disease. EKG showed NSR, LBBB, possible LVH.   I saw patient in clinic for ER follow-up on 06/16/23.  At that time, patient denied recurrence of chest pain. She was able to participate in exercise classes 4 times per week without symptoms. We ordered echocardiogram that showed EF 55%, no regional wall motion abnormalities, grade I DD, normal RV function, mild MR.   Today the patient presents accompanied by her son.  Reports that she has overall been feeling well and denies any specific cardiac concerns or complaints.  She has not had recurrence of chest chest pain.  She occasionally has twinges of chest pain at night.  These last for few seconds at a time and resolve on their own.  She has continued to participate in exercise classes.  Is able to exercise without chest pain or dyspnea on exertion.  She denies palpitations, dizziness, syncope, near syncope.  Denies lower extremity swelling.  She follows with her primary care provider who keeps a close eye on her blood  pressure.  Her amlodipine was recently discontinued.  Studies Reviewed Cardiac Studies & Procedures   ______________________________________________________________________________________________   STRESS TESTS  MYOCARDIAL PERFUSION IMAGING 08/15/2020  Interpretation Summary  The left ventricular ejection fraction is hyperdynamic (>65%).  Nuclear stress EF: 77%.  There was no ST segment deviation noted during stress.  The study is normal.  This is a low risk study with no evidence of ischemia.   ECHOCARDIOGRAM  ECHOCARDIOGRAM COMPLETE 07/20/2023  Narrative ECHOCARDIOGRAM REPORT    Patient Name:   Sherry Oconnor  Date of Exam: 07/20/2023 Medical Rec #:  [id number]     Height:       60.0 in Accession #:    [id number]    Weight:       112.6 lb Date of Birth:  01/28/1932    BSA:          1.462 m Patient Age:    89 years      BP:           138/70 mmHg Patient Gender: F             HR:           60 bpm. Exam Location:  [address]  Procedure: 2D Echo, Cardiac Doppler, Color Doppler and 3D Echo (Both Spectral and Color Flow Doppler were utilized during procedure).  Indications:    Chest Pain R07.9; ; I44.7 LBBB  History:        Patient has no prior history of Echocardiogram examinations.  Risk Factors:Hypertension and Diabetes.  Sonographer:    Augustin Seals RDCS Referring Phys: [id number] ROLLO JONELLE LOUDER  IMPRESSIONS   1. Abnormal septal motion . Left ventricular ejection fraction, by estimation, is 55%. The left ventricle has low normal function. The left ventricle has no regional wall motion abnormalities. Left ventricular diastolic parameters are consistent with Grade I diastolic dysfunction (impaired relaxation). 2. Right ventricular systolic function is normal. The right ventricular size is normal. 3. Mobile fossa with no obvious PFO. 4. The mitral valve is abnormal. Mild mitral valve regurgitation. No evidence of mitral stenosis. 5. The aortic valve is  tricuspid. Aortic valve regurgitation is not visualized. No aortic stenosis is present. 6. The inferior vena cava is normal in size with greater than 50% respiratory variability, suggesting right atrial pressure of 3 mmHg.  FINDINGS Left Ventricle: Abnormal septal motion. Left ventricular ejection fraction, by estimation, is 55%. The left ventricle has low normal function. The left ventricle has no regional wall motion abnormalities. Strain was performed and the global longitudinal strain is indeterminate. The left ventricular internal cavity size was normal in size. There is no left ventricular hypertrophy. Left ventricular diastolic parameters are consistent with Grade I diastolic dysfunction (impaired relaxation).  Right Ventricle: The right ventricular size is normal. No increase in right ventricular wall thickness. Right ventricular systolic function is normal.  Left Atrium: Left atrial size was normal in size.  Right Atrium: Right atrial size was normal in size.  Pericardium: There is no evidence of pericardial effusion.  Mitral Valve: The mitral valve is abnormal. There is mild thickening of the mitral valve leaflet(s). Mild mitral valve regurgitation. No evidence of mitral valve stenosis.  Tricuspid Valve: The tricuspid valve is normal in structure. Tricuspid valve regurgitation is mild . No evidence of tricuspid stenosis.  Aortic Valve: The aortic valve is tricuspid. Aortic valve regurgitation is not visualized. No aortic stenosis is present.  Pulmonic Valve: The pulmonic valve was normal in structure. Pulmonic valve regurgitation is not visualized. No evidence of pulmonic stenosis.  Aorta: The aortic root is normal in size and structure.  Venous: The inferior vena cava is normal in size with greater than 50% respiratory variability, suggesting right atrial pressure of 3 mmHg.  IAS/Shunts: No atrial level shunt detected by color flow Doppler.  Additional Comments: 3D was  performed not requiring image post processing on an independent workstation and was indeterminate.   LEFT VENTRICLE PLAX 2D LVIDd:         4.30 cm   Diastology LVIDs:         3.30 cm   LV e' medial:    6.09 cm/s LV PW:         0.90 cm   LV E/e' medial:  9.6 LV IVS:        1.10 cm   LV e' lateral:   10.60 cm/s LVOT diam:     2.10 cm   LV E/e' lateral: 5.5 LV SV:         64 LV SV Index:   44 LVOT Area:     3.46 cm  3D Volume EF: 3D EF:        60 % LV EDV:       93 ml LV ESV:       37 ml LV SV:        56 ml  RIGHT VENTRICLE RV Basal diam:  3.40 cm RV Mid diam:    2.80 cm RV S prime:  11.30 cm/s TAPSE (M-mode): 2.0 cm  LEFT ATRIUM             Index        RIGHT ATRIUM           Index LA diam:        3.70 cm 2.53 cm/m   RA Area:     10.20 cm LA Vol (A2C):   35.8 ml 24.48 ml/m  RA Volume:   20.50 ml  14.02 ml/m LA Vol (A4C):   33.2 ml 22.70 ml/m LA Biplane Vol: 36.3 ml 24.82 ml/m AORTIC VALVE LVOT Vmax:   87.00 cm/s LVOT Vmean:  57.100 cm/s LVOT VTI:    0.185 m  AORTA Ao Root diam: 3.00 cm Ao Asc diam:  3.40 cm  MITRAL VALVE MV Area (PHT): 3.31 cm    SHUNTS MV Decel Time: 229 msec    Systemic VTI:  0.18 m MV E velocity: 58.20 cm/s  Systemic Diam: 2.10 cm MV A velocity: 87.50 cm/s MV E/A ratio:  0.67  Maude Emmer MD Electronically signed by Maude Emmer MD Signature Date/Time: 07/20/2023/2:04:15 PM    Final          ______________________________________________________________________________________________       Risk Assessment/Calculations           Physical Exam VS:  BP 122/64   Pulse 74   Ht 5' 1 (1.549 m)   Wt 112 lb 6.4 oz (51 kg)   SpO2 98%   BMI 21.24 kg/m        Wt Readings from Last 3 Encounters:  09/25/23 112 lb 6.4 oz (51 kg)  06/16/23 112 lb 9.6 oz (51.1 kg)  05/17/23 108 lb 0.4 oz (49 kg)    GEN: Well nourished, well developed in no acute distress. Sitting comfortably in the chair  NECK: No JVD  CARDIAC:  RRR,  no murmurs, rubs, gallops. Radial pulses 2+ bilaterally  RESPIRATORY:  Clear to auscultation without rales, wheezing or rhonchi. Normal WOB on room air   ABDOMEN: non-distended EXTREMITIES:  No edema in BLE; No deformity   ASSESSMENT AND PLAN  Chest Pain  LBBB - Previously, nuclear stress test from 08/2020 was a normal, low risk study  - Seen in the ED 3/16 with chest pain. Hstn negative x2. EKG with LBBB, which has been chronic.  When I saw her in follow-up on 4/15, she had denied recurrence of chest pain - Echocardiogram 07/2023 showed EF 55%, no wall motion abnormalities, grade I DD, normal RV systolic function, mild MR. We have thoroughly reviewed echocardiogram results today. - Patient has occasional twinges of chest pain at night while at rest. Episodes last seconds at a time. She takes exercise classes and regularly walks around her living facility. Denies chest pain, shortness of breath on exertion.  No indication for ischemic evaluation at this time   HTN  - BP is well controlled. Denies dizziness, syncope, near syncope  - Monitored closely by PCP- she checks her BP daily and her machine sends it to PCP for review. BP has been well controlled at home  - Continue Lisinopril 10 mg daily  -  creatinine 0.86 on 3/16, K 4.7 on 5/12    HLD  - Lipid panel from 07/2023 showed LDL 77, HDL 79, triglycerides 102, total cholesterol 174  - Continue pravastatin 80 mg daily  - Followed by PCP      Dispo: Follow up in 12 months with Dr. Okey, or earlier as needed  Signed, Rollo FABIENE Louder, PA-C

## 2023-09-25 ENCOUNTER — Encounter: Payer: Self-pay | Admitting: Cardiology

## 2023-09-25 ENCOUNTER — Ambulatory Visit: Attending: Cardiology | Admitting: Cardiology

## 2023-09-25 VITALS — BP 122/64 | HR 74 | Ht 61.0 in | Wt 112.4 lb

## 2023-09-25 DIAGNOSIS — I10 Essential (primary) hypertension: Secondary | ICD-10-CM | POA: Insufficient documentation

## 2023-09-25 DIAGNOSIS — E782 Mixed hyperlipidemia: Secondary | ICD-10-CM | POA: Insufficient documentation

## 2023-09-25 DIAGNOSIS — R079 Chest pain, unspecified: Secondary | ICD-10-CM | POA: Diagnosis not present

## 2023-09-25 DIAGNOSIS — I447 Left bundle-branch block, unspecified: Secondary | ICD-10-CM | POA: Insufficient documentation

## 2023-09-25 NOTE — Addendum Note (Signed)
 Addended by: VICCI ROLLO SAUNDERS on: 09/25/2023 09:35 AM   Modules accepted: Orders

## 2023-09-25 NOTE — Patient Instructions (Signed)
 Medication Instructions:  No changes *If you need a refill on your cardiac medications before your next appointment, please call your pharmacy*  Lab Work: No labs  Testing/Procedures: No testing  Follow-Up: At [facility], you and your health needs are our priority.  As part of our continuing mission to provide you with exceptional heart care, our providers are all part of one team.  This team includes your primary Cardiologist (physician) and Advanced Practice Providers or APPs (Physician Assistants and Nurse Practitioners) who all work together to provide you with the care you need, when you need it.  Your next appointment:   1 year(s)  Provider:   Vina Gull, MD    We recommend signing up for the patient portal called MyChart.  Sign up information is provided on this After Visit Summary.  MyChart is used to connect with patients for Virtual Visits (Telemedicine).  Patients are able to view lab/test results, encounter notes, upcoming appointments, etc.  Non-urgent messages can be sent to your provider as well.   To learn more about what you can do with MyChart, go to [email or web address].

## 2023-09-28 DIAGNOSIS — D49512 Neoplasm of unspecified behavior of left kidney: Secondary | ICD-10-CM | POA: Diagnosis not present

## 2023-09-28 DIAGNOSIS — D4102 Neoplasm of uncertain behavior of left kidney: Secondary | ICD-10-CM | POA: Diagnosis not present

## 2023-10-01 DIAGNOSIS — E785 Hyperlipidemia, unspecified: Secondary | ICD-10-CM | POA: Diagnosis not present

## 2023-10-01 DIAGNOSIS — F4321 Adjustment disorder with depressed mood: Secondary | ICD-10-CM | POA: Diagnosis not present

## 2023-10-01 DIAGNOSIS — E1169 Type 2 diabetes mellitus with other specified complication: Secondary | ICD-10-CM | POA: Diagnosis not present

## 2023-10-01 DIAGNOSIS — E1142 Type 2 diabetes mellitus with diabetic polyneuropathy: Secondary | ICD-10-CM | POA: Diagnosis not present

## 2023-10-01 DIAGNOSIS — I10 Essential (primary) hypertension: Secondary | ICD-10-CM | POA: Diagnosis not present

## 2023-10-08 DIAGNOSIS — E1169 Type 2 diabetes mellitus with other specified complication: Secondary | ICD-10-CM | POA: Diagnosis not present

## 2023-10-08 DIAGNOSIS — E1142 Type 2 diabetes mellitus with diabetic polyneuropathy: Secondary | ICD-10-CM | POA: Diagnosis not present

## 2023-10-08 DIAGNOSIS — I10 Essential (primary) hypertension: Secondary | ICD-10-CM | POA: Diagnosis not present

## 2023-11-01 DIAGNOSIS — I10 Essential (primary) hypertension: Secondary | ICD-10-CM | POA: Diagnosis not present

## 2023-11-01 DIAGNOSIS — E1142 Type 2 diabetes mellitus with diabetic polyneuropathy: Secondary | ICD-10-CM | POA: Diagnosis not present

## 2023-11-01 DIAGNOSIS — E785 Hyperlipidemia, unspecified: Secondary | ICD-10-CM | POA: Diagnosis not present

## 2023-11-01 DIAGNOSIS — F4321 Adjustment disorder with depressed mood: Secondary | ICD-10-CM | POA: Diagnosis not present

## 2023-11-01 DIAGNOSIS — E1169 Type 2 diabetes mellitus with other specified complication: Secondary | ICD-10-CM | POA: Diagnosis not present

## 2023-11-07 DIAGNOSIS — I10 Essential (primary) hypertension: Secondary | ICD-10-CM | POA: Diagnosis not present

## 2023-11-07 DIAGNOSIS — E1169 Type 2 diabetes mellitus with other specified complication: Secondary | ICD-10-CM | POA: Diagnosis not present

## 2023-11-07 DIAGNOSIS — E1142 Type 2 diabetes mellitus with diabetic polyneuropathy: Secondary | ICD-10-CM | POA: Diagnosis not present

## 2023-11-13 DIAGNOSIS — E119 Type 2 diabetes mellitus without complications: Secondary | ICD-10-CM | POA: Diagnosis not present

## 2023-11-13 DIAGNOSIS — M79675 Pain in left toe(s): Secondary | ICD-10-CM | POA: Diagnosis not present

## 2023-11-13 DIAGNOSIS — L84 Corns and callosities: Secondary | ICD-10-CM | POA: Diagnosis not present

## 2023-11-13 DIAGNOSIS — B351 Tinea unguium: Secondary | ICD-10-CM | POA: Diagnosis not present

## 2023-11-17 ENCOUNTER — Telehealth: Payer: Self-pay | Admitting: Internal Medicine

## 2023-11-17 DIAGNOSIS — I447 Left bundle-branch block, unspecified: Secondary | ICD-10-CM

## 2023-11-17 DIAGNOSIS — R001 Bradycardia, unspecified: Secondary | ICD-10-CM | POA: Diagnosis not present

## 2023-11-17 NOTE — Telephone Encounter (Signed)
 Called about low HR -- Per APP - completely asymptomatic with HR ihn 380-40s => REcommend ~ 3 D Zio arrange close f/u with LOIS Louder or Dr. Okey after monitor done.   Alm Clay, MD

## 2023-11-17 NOTE — Telephone Encounter (Signed)
 3 day zio ordered for Dr Vina Gull to read per Dr Anner.

## 2023-11-17 NOTE — Telephone Encounter (Signed)
 STAT if HR is under 50 or over 120 (normal HR is 60-100 beats per minute)  What is your heart rate? 38  Do you have a log of your heart rate readings (document readings)?   Do you have any other symptoms? no

## 2023-11-18 ENCOUNTER — Ambulatory Visit: Attending: Internal Medicine

## 2023-11-18 DIAGNOSIS — R001 Bradycardia, unspecified: Secondary | ICD-10-CM

## 2023-11-18 DIAGNOSIS — I447 Left bundle-branch block, unspecified: Secondary | ICD-10-CM

## 2023-11-18 NOTE — Progress Notes (Unsigned)
 Enrolled for [employer] to mail a ZIO XT long term holter monitor to the patients address on file.

## 2023-11-19 ENCOUNTER — Other Ambulatory Visit: Payer: Self-pay

## 2023-11-19 ENCOUNTER — Inpatient Hospital Stay (HOSPITAL_BASED_OUTPATIENT_CLINIC_OR_DEPARTMENT_OTHER)
Admission: EM | Admit: 2023-11-19 | Discharge: 2023-11-21 | DRG: 243 | Disposition: A | Discharge status: Home / Self Care | Attending: Internal Medicine | Admitting: Internal Medicine

## 2023-11-19 DIAGNOSIS — Z79899 Other long term (current) drug therapy: Secondary | ICD-10-CM | POA: Diagnosis not present

## 2023-11-19 DIAGNOSIS — E1142 Type 2 diabetes mellitus with diabetic polyneuropathy: Secondary | ICD-10-CM | POA: Diagnosis present

## 2023-11-19 DIAGNOSIS — Z1501 Genetic susceptibility to malignant neoplasm of breast: Secondary | ICD-10-CM | POA: Diagnosis not present

## 2023-11-19 DIAGNOSIS — Z90710 Acquired absence of both cervix and uterus: Secondary | ICD-10-CM

## 2023-11-19 DIAGNOSIS — Z85828 Personal history of other malignant neoplasm of skin: Secondary | ICD-10-CM | POA: Diagnosis not present

## 2023-11-19 DIAGNOSIS — Z7984 Long term (current) use of oral hypoglycemic drugs: Secondary | ICD-10-CM | POA: Diagnosis not present

## 2023-11-19 DIAGNOSIS — I441 Atrioventricular block, second degree: Principal | ICD-10-CM | POA: Diagnosis present

## 2023-11-19 DIAGNOSIS — Z96612 Presence of left artificial shoulder joint: Secondary | ICD-10-CM | POA: Diagnosis present

## 2023-11-19 DIAGNOSIS — E872 Acidosis, unspecified: Secondary | ICD-10-CM | POA: Diagnosis present

## 2023-11-19 DIAGNOSIS — R001 Bradycardia, unspecified: Principal | ICD-10-CM | POA: Diagnosis present

## 2023-11-19 DIAGNOSIS — I5032 Chronic diastolic (congestive) heart failure: Secondary | ICD-10-CM | POA: Diagnosis present

## 2023-11-19 DIAGNOSIS — Z803 Family history of malignant neoplasm of breast: Secondary | ICD-10-CM | POA: Diagnosis not present

## 2023-11-19 DIAGNOSIS — I110 Hypertensive heart disease with heart failure: Secondary | ICD-10-CM | POA: Diagnosis not present

## 2023-11-19 DIAGNOSIS — Z885 Allergy status to narcotic agent status: Secondary | ICD-10-CM | POA: Diagnosis not present

## 2023-11-19 DIAGNOSIS — R7989 Other specified abnormal findings of blood chemistry: Secondary | ICD-10-CM | POA: Diagnosis not present

## 2023-11-19 DIAGNOSIS — Z888 Allergy status to other drugs, medicaments and biological substances status: Secondary | ICD-10-CM

## 2023-11-19 DIAGNOSIS — Z882 Allergy status to sulfonamides status: Secondary | ICD-10-CM

## 2023-11-19 DIAGNOSIS — I447 Left bundle-branch block, unspecified: Secondary | ICD-10-CM | POA: Diagnosis present

## 2023-11-19 DIAGNOSIS — Z8249 Family history of ischemic heart disease and other diseases of the circulatory system: Secondary | ICD-10-CM | POA: Diagnosis not present

## 2023-11-19 DIAGNOSIS — Z800 Family history of malignant neoplasm of digestive organs: Secondary | ICD-10-CM

## 2023-11-19 DIAGNOSIS — Z91040 Latex allergy status: Secondary | ICD-10-CM | POA: Diagnosis not present

## 2023-11-19 DIAGNOSIS — R9431 Abnormal electrocardiogram [ECG] [EKG]: Secondary | ICD-10-CM | POA: Diagnosis not present

## 2023-11-19 DIAGNOSIS — Z950 Presence of cardiac pacemaker: Secondary | ICD-10-CM | POA: Diagnosis not present

## 2023-11-19 DIAGNOSIS — Z471 Aftercare following joint replacement surgery: Secondary | ICD-10-CM | POA: Diagnosis not present

## 2023-11-19 DIAGNOSIS — E7800 Pure hypercholesterolemia, unspecified: Secondary | ICD-10-CM | POA: Diagnosis not present

## 2023-11-19 DIAGNOSIS — E042 Nontoxic multinodular goiter: Secondary | ICD-10-CM | POA: Diagnosis present

## 2023-11-19 DIAGNOSIS — E1165 Type 2 diabetes mellitus with hyperglycemia: Secondary | ICD-10-CM | POA: Diagnosis present

## 2023-11-19 LAB — CBC WITH DIFFERENTIAL/PLATELET
Abs Immature Granulocytes: 0.01 K/uL (ref 0.00–0.07)
Basophils Absolute: 0 K/uL (ref 0.0–0.1)
Basophils Relative: 0 %
Eosinophils Absolute: 0.1 K/uL (ref 0.0–0.5)
Eosinophils Relative: 2 %
HCT: 38.6 % (ref 36.0–46.0)
Hemoglobin: 13.1 g/dL (ref 12.0–15.0)
Immature Granulocytes: 0 %
Lymphocytes Relative: 44 %
Lymphs Abs: 3.3 K/uL (ref 0.7–4.0)
MCH: 30.9 pg (ref 26.0–34.0)
MCHC: 33.9 g/dL (ref 30.0–36.0)
MCV: 91 fL (ref 80.0–100.0)
Monocytes Absolute: 0.8 K/uL (ref 0.1–1.0)
Monocytes Relative: 11 %
Neutro Abs: 3.3 K/uL (ref 1.7–7.7)
Neutrophils Relative %: 43 %
Platelets: 203 K/uL (ref 150–400)
RBC: 4.24 MIL/uL (ref 3.87–5.11)
RDW: 12.3 % (ref 11.5–15.5)
WBC: 7.6 K/uL (ref 4.0–10.5)
nRBC: 0 % (ref 0.0–0.2)

## 2023-11-19 LAB — TROPONIN T, HIGH SENSITIVITY
Troponin T High Sensitivity: 40 ng/L — ABNORMAL HIGH (ref 0–19)
Troponin T High Sensitivity: 47 ng/L — ABNORMAL HIGH (ref 0–19)

## 2023-11-19 LAB — BASIC METABOLIC PANEL WITH GFR
Anion gap: 16 — ABNORMAL HIGH (ref 5–15)
BUN: 18 mg/dL (ref 8–23)
CO2: 21 mmol/L — ABNORMAL LOW (ref 22–32)
Calcium: 10 mg/dL (ref 8.9–10.3)
Chloride: 107 mmol/L (ref 98–111)
Creatinine, Ser: 0.96 mg/dL (ref 0.44–1.00)
GFR, Estimated: 56 mL/min — ABNORMAL LOW (ref >60)
Glucose, Bld: 138 mg/dL — ABNORMAL HIGH (ref 70–99)
Potassium: 3.7 mmol/L (ref 3.5–5.1)
Sodium: 144 mmol/L (ref 135–145)

## 2023-11-19 LAB — MAGNESIUM: Magnesium: 1.9 mg/dL (ref 1.7–2.4)

## 2023-11-19 NOTE — ED Provider Notes (Signed)
 [place] [facility] AT [facility] Provider Note   CSN: [id number] Arrival date & time: 11/19/23  2034     Patient presents with: Bradycardia   Sherry Oconnor is a 89 y.o. female.   89 year old female presents for evaluation of low heart rate.  States she has a history of low heart rate and has not been on a heart rate monitor.  She states over the last few days she has felt increasingly weak and fatigued as well as had difficulty walking up the steps and has been more short of breath.  Denies any chest pain or any other symptoms or concerns.        Prior to Admission medications   Medication Sig Start Date End Date Taking? Authorizing Provider  B Complex Vitamins (VITAMIN B COMPLEX PO) Take by mouth daily.    [provider]  calcium carbonate (TUMS - DOSED IN MG ELEMENTAL CALCIUM) 500 MG chewable tablet Chew 500 mg by mouth daily as needed for indigestion or heartburn.    [provider]  fluticasone (FLONASE) 50 MCG/ACT nasal spray Place 2 sprays into the nose 2 (two) times daily as needed for allergies.     [provider]  gabapentin (NEURONTIN) 300 MG capsule Take 300 mg by mouth daily.    [provider]  HYDROcodone -acetaminophen  (NORCO/VICODIN) 5-325 MG tablet Take 1-2 tablets by mouth every 6 (six) hours as needed for moderate pain. 09/13/20   Porterfield, Hospital doctor, PA-C  Lancets (ONETOUCH DELICA PLUS [place]) MISC as needed. 01/09/23   [provider]  lisinopril (ZESTRIL) 10 MG tablet Take 10 mg by mouth daily.    [provider]  loratadine (CLARITIN) 10 MG tablet Take 10 mg by mouth daily. 12/21/20   [provider]  metFORMIN (GLUCOPHAGE-XR) 750 MG 24 hr tablet Take 1,500 mg by mouth daily with supper.     [provider]  ondansetron  (ZOFRAN ) 4 MG tablet Take 1 tablet (4 mg total) by mouth daily as needed for nausea or vomiting. 09/13/20   Porterfield, Amber, PA-C  ONETOUCH ULTRA  test strip as needed. 01/09/23   [provider]  pravastatin (PRAVACHOL) 80 MG tablet Take 80 mg by mouth at bedtime.     [provider]  sucralfate  (CARAFATE ) 1 g tablet Take 1 tablet (1 g total) by mouth 4 (four) times daily -  with meals and at bedtime. 05/17/23   Charlyn Sora, MD  tiZANidine  (ZANAFLEX ) 2 MG tablet Take 1 tablet (2 mg total) by mouth every 8 (eight) hours as needed for muscle spasms. 09/13/20   Porterfield, Hospital doctor, PA-C  vitamin B-12 (CYANOCOBALAMIN ) 1000 MCG tablet Take 1,000 mcg by mouth daily.    [provider]  Vitamin D, Ergocalciferol, (DRISDOL) 1.25 MG (50000 UT) CAPS capsule Take 50,000 Units by mouth every 14 (fourteen) days.    [provider]    Allergies: Darvon, Septra [bactrim], Sulfa drugs cross reactors, Zithromax [azithromycin], Codeine, Latex, Naproxen, and Tessalon perles [benzonatate]    Review of Systems  Constitutional:  Positive for fatigue. Negative for chills and fever.  HENT:  Negative for ear pain and sore throat.   Eyes:  Negative for pain and visual disturbance.  Respiratory:  Positive for shortness of breath. Negative for cough.   Cardiovascular:  Negative for chest pain and palpitations.  Gastrointestinal:  Negative for abdominal pain and vomiting.  Genitourinary:  Negative for dysuria and hematuria.  Musculoskeletal:  Negative for arthralgias and back pain.  Skin:  Negative for color change and rash.  Neurological:  Positive for weakness and light-headedness. Negative for seizures and syncope.  All other systems reviewed and are negative.   Updated Vital Signs BP (!) 177/55   Pulse 68   Temp 98.7 F (37.1 C) (Oral)   Resp 15   Ht 5' (1.524 m)   Wt 50.8 kg   SpO2 97%   BMI 21.87 kg/m   Physical Exam Vitals and nursing note reviewed.  Constitutional:      General: She is not in acute distress.    Appearance: Normal appearance. She is well-developed. She is not ill-appearing.  HENT:      Head: Normocephalic and atraumatic.  Eyes:     Conjunctiva/sclera: Conjunctivae normal.  Cardiovascular:     Rate and Rhythm: Regular rhythm. Bradycardia present.     Heart sounds: No murmur heard. Pulmonary:     Effort: Pulmonary effort is normal. No respiratory distress.     Breath sounds: Normal breath sounds.  Abdominal:     Palpations: Abdomen is soft.     Tenderness: There is no abdominal tenderness.  Musculoskeletal:        General: No swelling.     Cervical back: Neck supple.  Skin:    General: Skin is warm and dry.     Capillary Refill: Capillary refill takes less than 2 seconds.  Neurological:     Mental Status: She is alert.  Psychiatric:        Mood and Affect: Mood normal.     (all labs ordered are listed, but only abnormal results are displayed) Labs Reviewed  BASIC METABOLIC PANEL WITH GFR - Abnormal; Notable for the following components:      Result Value   CO2 21 (*)    Glucose, Bld 138 (*)    GFR, Estimated 56 (*)    Anion gap 16 (*)    All other components within normal limits  TROPONIN T, HIGH SENSITIVITY - Abnormal; Notable for the following components:   Troponin T High Sensitivity 40 (*)    All other components within normal limits  CBC WITH DIFFERENTIAL/PLATELET  MAGNESIUM  TROPONIN T, HIGH SENSITIVITY    EKG: None  Radiology: No results found.   Procedures   Medications Ordered in the ED - No data to display  Clinical Course as of 11/19/23 2324  Thu Nov 19, 2023  2156 EKG not crossing over into MUSE shows sinus bradycardia with a left bundle branch block, rate is 40 [MK]    Clinical Course User Index [MK] Gennaro Duwaine CROME, DO                                 Medical Decision Making Cardiac monitor interpretation: Sinus bradycardia  Patient here for symptomatic bradycardia.  She has an elevated troponin and remains stable with a heart rate in the 30s to low 40s.  She is fairly asymptomatic when lying in bed.  I discussed  patient's case with cardiology, Dr. Shlomo who is on-call and she will see the patient in the morning.  Recommended transfer for further management to colon.  I spoke with Dr. Charlton, hospitalist and he accept the patient to his service for further workup and management.  Patient and family bedside are agreeable with the plan.  Problems Addressed: Elevated troponin: undiagnosed new problem with uncertain prognosis Symptomatic bradycardia: undiagnosed new problem with uncertain prognosis  Amount and/or Complexity of  Data Reviewed External Data Reviewed: notes.    Details: Outpatient records reviewed and patient has been following with cardiology and primary care for some bradycardia for some time Labs: ordered. Decision-making details documented in ED Course.    Details: Ordered and reviewed by me and troponin is positive, however otherwise labs are unremarkable ECG/medicine tests: ordered and independent interpretation performed. Decision-making details documented in ED Course.    Details: Ordered and interpreted by me in the absence of cardiology and shows sinus bradycardia with a right intraventricular conduction delay Discussion of management or test interpretation with external provider(s): Dr. Turner-cardiology-I spoke with her on the phone regarding the patient's case and the cardiology plan to see the patient in the morning  Dr. Silvia spoke with him regarding the patient's case and he accept the patient to his service for further workup and management  Risk OTC drugs. Prescription drug management. Drug therapy requiring intensive monitoring for toxicity. Decision regarding hospitalization.    Final diagnoses:  Symptomatic bradycardia  Elevated troponin    ED Discharge Orders     None          Gennaro Duwaine CROME, DO 11/19/23 2324

## 2023-11-19 NOTE — Telephone Encounter (Signed)
 Called  patient. To follow if she was aware that she need to wear a 3 day Zio. She states the monitor has not arrived yet.   She was given an appt earlier today for 11/23/23. RN informed patient we could move apt out so the result of monitor will be present.   Patient states she would rather come because her primary told her heart was low, and she is concerned.  RN informed patient to keep appointment  and if she receive the monitor go had and place it on her left chest area.  Reena RN

## 2023-11-19 NOTE — Progress Notes (Signed)
 Plan of Care Note for accepted transfer   Patient: Sherry Oconnor MRN: [id number]   DOA: 11/19/2023  Facility requesting transfer: [facility]   Requesting Provider: Dr. Gennaro  Reason for transfer: Bradycardia   Facility course: 89 yr old female with HTN, HLD, DM, and recent bradycardia presents with increased fatigue and DOE.   HR has been in upper 30s to mid 50s with stable BP. EKG demonstrates sinus bradycardia with rate 40, 1st degree AV block, and LBBB.   Dr. Shlomo of cardiology was consulted by the ED physician, indicated that they will see patient in the morning, and recommended hospitalist admission.   Plan of care: The patient is accepted for admission to Progressive unit, at [facility].   Author: Evalene GORMAN Sprinkles, MD 11/19/2023  Check [email or web address] for on-call coverage.  Nursing staff, Please call [facility] Admits & Consults System-Wide number on Amion as soon as patient's arrival, so appropriate admitting provider can evaluate the pt.

## 2023-11-19 NOTE — ED Notes (Signed)
 Called report to [facility] and spoke to [facility]

## 2023-11-19 NOTE — ED Triage Notes (Signed)
 Pt POV reporting bradycardia x1 week, seen by cardiologist, LBBB, heart monitor ordered but has not come in yet, advised to come to ED if she felt worse, endorsing increased fatigue and SOB.

## 2023-11-20 ENCOUNTER — Inpatient Hospital Stay (HOSPITAL_COMMUNITY)
Admission: EM | Disposition: A | Discharge status: Home / Self Care | Payer: Self-pay | Source: Home / Self Care | Attending: Internal Medicine

## 2023-11-20 ENCOUNTER — Inpatient Hospital Stay (HOSPITAL_COMMUNITY)

## 2023-11-20 ENCOUNTER — Encounter (HOSPITAL_COMMUNITY): Payer: Self-pay | Admitting: Internal Medicine

## 2023-11-20 DIAGNOSIS — Z79899 Other long term (current) drug therapy: Secondary | ICD-10-CM | POA: Diagnosis not present

## 2023-11-20 DIAGNOSIS — I5032 Chronic diastolic (congestive) heart failure: Secondary | ICD-10-CM | POA: Diagnosis present

## 2023-11-20 DIAGNOSIS — R9431 Abnormal electrocardiogram [ECG] [EKG]: Secondary | ICD-10-CM | POA: Diagnosis not present

## 2023-11-20 DIAGNOSIS — R7989 Other specified abnormal findings of blood chemistry: Secondary | ICD-10-CM

## 2023-11-20 DIAGNOSIS — Z803 Family history of malignant neoplasm of breast: Secondary | ICD-10-CM | POA: Diagnosis not present

## 2023-11-20 DIAGNOSIS — E7800 Pure hypercholesterolemia, unspecified: Secondary | ICD-10-CM | POA: Diagnosis present

## 2023-11-20 DIAGNOSIS — I110 Hypertensive heart disease with heart failure: Secondary | ICD-10-CM | POA: Diagnosis present

## 2023-11-20 DIAGNOSIS — R001 Bradycardia, unspecified: Secondary | ICD-10-CM | POA: Diagnosis present

## 2023-11-20 DIAGNOSIS — Z8249 Family history of ischemic heart disease and other diseases of the circulatory system: Secondary | ICD-10-CM | POA: Diagnosis not present

## 2023-11-20 DIAGNOSIS — I441 Atrioventricular block, second degree: Principal | ICD-10-CM

## 2023-11-20 DIAGNOSIS — E042 Nontoxic multinodular goiter: Secondary | ICD-10-CM | POA: Diagnosis present

## 2023-11-20 DIAGNOSIS — I447 Left bundle-branch block, unspecified: Secondary | ICD-10-CM | POA: Diagnosis present

## 2023-11-20 DIAGNOSIS — E1142 Type 2 diabetes mellitus with diabetic polyneuropathy: Secondary | ICD-10-CM | POA: Diagnosis present

## 2023-11-20 DIAGNOSIS — Z96612 Presence of left artificial shoulder joint: Secondary | ICD-10-CM | POA: Diagnosis present

## 2023-11-20 DIAGNOSIS — Z7984 Long term (current) use of oral hypoglycemic drugs: Secondary | ICD-10-CM | POA: Diagnosis not present

## 2023-11-20 DIAGNOSIS — E872 Acidosis, unspecified: Secondary | ICD-10-CM | POA: Diagnosis present

## 2023-11-20 DIAGNOSIS — Z1501 Genetic susceptibility to malignant neoplasm of breast: Secondary | ICD-10-CM | POA: Diagnosis not present

## 2023-11-20 DIAGNOSIS — Z800 Family history of malignant neoplasm of digestive organs: Secondary | ICD-10-CM | POA: Diagnosis not present

## 2023-11-20 DIAGNOSIS — Z888 Allergy status to other drugs, medicaments and biological substances status: Secondary | ICD-10-CM | POA: Diagnosis not present

## 2023-11-20 DIAGNOSIS — Z90710 Acquired absence of both cervix and uterus: Secondary | ICD-10-CM | POA: Diagnosis not present

## 2023-11-20 DIAGNOSIS — Z91040 Latex allergy status: Secondary | ICD-10-CM | POA: Diagnosis not present

## 2023-11-20 DIAGNOSIS — Z885 Allergy status to narcotic agent status: Secondary | ICD-10-CM | POA: Diagnosis not present

## 2023-11-20 DIAGNOSIS — Z882 Allergy status to sulfonamides status: Secondary | ICD-10-CM | POA: Diagnosis not present

## 2023-11-20 DIAGNOSIS — E1165 Type 2 diabetes mellitus with hyperglycemia: Secondary | ICD-10-CM | POA: Diagnosis present

## 2023-11-20 DIAGNOSIS — Z85828 Personal history of other malignant neoplasm of skin: Secondary | ICD-10-CM | POA: Diagnosis not present

## 2023-11-20 HISTORY — PX: PACEMAKER IMPLANT: EP1218

## 2023-11-20 LAB — CBC
HCT: 34.4 % — ABNORMAL LOW (ref 36.0–46.0)
Hemoglobin: 11.6 g/dL — ABNORMAL LOW (ref 12.0–15.0)
MCH: 31.1 pg (ref 26.0–34.0)
MCHC: 33.7 g/dL (ref 30.0–36.0)
MCV: 92.2 fL (ref 80.0–100.0)
Platelets: 175 K/uL (ref 150–400)
RBC: 3.73 MIL/uL — ABNORMAL LOW (ref 3.87–5.11)
RDW: 12.2 % (ref 11.5–15.5)
WBC: 6.1 K/uL (ref 4.0–10.5)
nRBC: 0 % (ref 0.0–0.2)

## 2023-11-20 LAB — ECHOCARDIOGRAM LIMITED
Area-P 1/2: 3.27 cm2
Height: 60 in
MV VTI: 2.48 cm2
S' Lateral: 3 cm
Weight: 1801.6 [oz_av]

## 2023-11-20 LAB — TSH: TSH: 5.501 u[IU]/mL — ABNORMAL HIGH (ref 0.350–4.500)

## 2023-11-20 LAB — BASIC METABOLIC PANEL WITH GFR
Anion gap: 4 — ABNORMAL LOW (ref 5–15)
BUN: 13 mg/dL (ref 8–23)
CO2: 24 mmol/L (ref 22–32)
Calcium: 7.8 mg/dL — ABNORMAL LOW (ref 8.9–10.3)
Chloride: 116 mmol/L — ABNORMAL HIGH (ref 98–111)
Creatinine, Ser: 0.89 mg/dL (ref 0.44–1.00)
GFR, Estimated: >60 mL/min (ref >60)
Glucose, Bld: 113 mg/dL — ABNORMAL HIGH (ref 70–99)
Potassium: 3.5 mmol/L (ref 3.5–5.1)
Sodium: 144 mmol/L (ref 135–145)

## 2023-11-20 LAB — GLUCOSE, CAPILLARY
Glucose-Capillary: 123 mg/dL — ABNORMAL HIGH (ref 70–99)
Glucose-Capillary: 146 mg/dL — ABNORMAL HIGH (ref 70–99)
Glucose-Capillary: 215 mg/dL — ABNORMAL HIGH (ref 70–99)

## 2023-11-20 LAB — MRSA NEXT GEN BY PCR, NASAL: MRSA by PCR Next Gen: NOT DETECTED

## 2023-11-20 LAB — SURGICAL PCR SCREEN
MRSA, PCR: NEGATIVE
Staphylococcus aureus: NEGATIVE

## 2023-11-20 LAB — PHOSPHORUS: Phosphorus: 3.4 mg/dL (ref 2.5–4.6)

## 2023-11-20 SURGERY — PACEMAKER IMPLANT

## 2023-11-20 MED ORDER — LIDOCAINE HCL 1 % IJ SOLN
INTRAMUSCULAR | Status: AC
  Filled 2023-11-20: qty 20

## 2023-11-20 MED ORDER — SODIUM CHLORIDE 0.9 % IV SOLN: INTRAVENOUS | Status: AC

## 2023-11-20 MED ORDER — INSULIN ASPART 100 UNIT/ML IJ SOLN
0.0000 [IU] | Freq: Three times a day (TID) | INTRAMUSCULAR | Status: DC
  Administered 2023-11-21: 1 [IU] via SUBCUTANEOUS

## 2023-11-20 MED ORDER — CEFAZOLIN SODIUM-DEXTROSE 2-4 GM/100ML-% IV SOLN
INTRAVENOUS | Status: AC
  Filled 2023-11-20: qty 100

## 2023-11-20 MED ORDER — SODIUM CHLORIDE 0.9% FLUSH
3.0000 mL | Freq: Two times a day (BID) | INTRAVENOUS | Status: DC
  Administered 2023-11-20 – 2023-11-21 (×3): 3 mL via INTRAVENOUS

## 2023-11-20 MED ORDER — PROCHLORPERAZINE EDISYLATE 10 MG/2ML IJ SOLN: 5.0000 mg | Freq: Four times a day (QID) | INTRAMUSCULAR | Status: DC | PRN

## 2023-11-20 MED ORDER — CEFAZOLIN SODIUM-DEXTROSE 2-4 GM/100ML-% IV SOLN
2.0000 g | INTRAVENOUS | Status: AC
  Administered 2023-11-20: 2 g via INTRAVENOUS
  Filled 2023-11-20: qty 100

## 2023-11-20 MED ORDER — HEPARIN (PORCINE) IN NACL 1000-0.9 UT/500ML-% IV SOLN
INTRAVENOUS | Status: DC | PRN
  Administered 2023-11-20: 500 mL

## 2023-11-20 MED ORDER — SODIUM CHLORIDE 0.9 % IV SOLN: INTRAVENOUS | Status: DC

## 2023-11-20 MED ORDER — POTASSIUM CHLORIDE CRYS ER 20 MEQ PO TBCR
60.0000 meq | EXTENDED_RELEASE_TABLET | Freq: Once | ORAL | Status: AC
  Administered 2023-11-20: 60 meq via ORAL
  Filled 2023-11-20: qty 3

## 2023-11-20 MED ORDER — LIDOCAINE HCL (PF) 1 % IJ SOLN
INTRAMUSCULAR | Status: DC | PRN
  Administered 2023-11-20: 30 mL

## 2023-11-20 MED ORDER — POLYETHYLENE GLYCOL 3350 17 G PO PACK: 17.0000 g | PACK | Freq: Every day | ORAL | Status: DC | PRN

## 2023-11-20 MED ORDER — LIDOCAINE HCL 1 % IJ SOLN
INTRAMUSCULAR | Status: AC
  Filled 2023-11-20: qty 40

## 2023-11-20 MED ORDER — SODIUM CHLORIDE 0.9% FLUSH: 3.0000 mL | INTRAVENOUS | Status: DC | PRN

## 2023-11-20 MED ORDER — SODIUM CHLORIDE 0.9 % IV SOLN
INTRAVENOUS | Status: AC
  Administered 2023-11-20: 80 mg
  Filled 2023-11-20: qty 2

## 2023-11-20 MED ORDER — ONDANSETRON HCL 4 MG/2ML IJ SOLN
INTRAMUSCULAR | Status: AC
  Filled 2023-11-20: qty 2

## 2023-11-20 MED ORDER — ONDANSETRON HCL 4 MG/2ML IJ SOLN
INTRAMUSCULAR | Status: DC | PRN
  Administered 2023-11-20: 4 mg via INTRAVENOUS

## 2023-11-20 MED ORDER — HEPARIN SODIUM (PORCINE) 5000 UNIT/ML IJ SOLN
5000.0000 [IU] | Freq: Three times a day (TID) | INTRAMUSCULAR | Status: DC
  Administered 2023-11-20 – 2023-11-21 (×3): 5000 [IU] via SUBCUTANEOUS
  Filled 2023-11-20 (×3): qty 1

## 2023-11-20 MED ORDER — MELATONIN 5 MG PO TABS
5.0000 mg | ORAL_TABLET | Freq: Every evening | ORAL | Status: DC | PRN
  Administered 2023-11-20: 5 mg via ORAL
  Filled 2023-11-20: qty 1

## 2023-11-20 MED ORDER — SODIUM CHLORIDE 0.9 % IV SOLN
80.0000 mg | INTRAVENOUS | Status: AC
  Filled 2023-11-20: qty 2

## 2023-11-20 MED ORDER — CHLORHEXIDINE GLUCONATE 4 % EX SOLN: 60.0000 mL | Freq: Once | CUTANEOUS | Status: DC

## 2023-11-20 MED ORDER — ACETAMINOPHEN 325 MG PO TABS
650.0000 mg | ORAL_TABLET | Freq: Four times a day (QID) | ORAL | Status: DC | PRN
  Administered 2023-11-20 – 2023-11-21 (×2): 650 mg via ORAL
  Filled 2023-11-20 (×2): qty 2

## 2023-11-20 MED ORDER — INSULIN ASPART 100 UNIT/ML IJ SOLN
0.0000 [IU] | Freq: Every day | INTRAMUSCULAR | Status: DC
  Administered 2023-11-20: 2 [IU] via SUBCUTANEOUS

## 2023-11-20 MED ORDER — CHLORHEXIDINE GLUCONATE 4 % EX SOLN
60.0000 mL | Freq: Once | CUTANEOUS | Status: AC
  Administered 2023-11-20: 4 via TOPICAL
  Filled 2023-11-20: qty 60

## 2023-11-20 SURGICAL SUPPLY — 13 items
CABLE SURGICAL S-101-97-12 (CABLE) ×1 IMPLANT
CATH CPS LOCATOR 3D MED (CATHETERS) IMPLANT
LEAD ULTIPACE 52 LPA1231/52 (Lead) IMPLANT
LEAD ULTIPACE 65 LPA1231/65 (Lead) IMPLANT
PACEMAKER ASSURITY DR-RF (Pacemaker) IMPLANT
PAD DEFIB RADIO PHYSIO CONN (PAD) ×1 IMPLANT
SHEATH 7FR PRELUDE SNAP 13 (SHEATH) IMPLANT
SHEATH 9FR PRELUDE SNAP 13 (SHEATH) IMPLANT
SHEATH PROBE COVER 6X72 (BAG) IMPLANT
SLITTER AGILIS HISPRO (INSTRUMENTS) IMPLANT
TOOL HELIX LOCKING (MISCELLANEOUS) IMPLANT
TRAY PACEMAKER INSERTION (PACKS) ×1 IMPLANT
WIRE HI TORQ VERSACORE-J 145CM (WIRE) IMPLANT

## 2023-11-20 NOTE — Progress Notes (Signed)
  Progress Note   Patient: Sherry Oconnor FMW:[id number] DOB: [birth year] DOA: 11/19/2023     0 DOS: the patient was seen and examined on 11/20/2023   Brief hospital course:  89 y.o. female with medical history significant for chronic LBBB, hypertension, hyperlipidemia, type 2 diabetes with diabetic polyneuropathy, who presented to [facility] ED with complaints of low heart rate for the past 10 days. Pt was admitted for further w/u of bradycardia. Cardiology was consulted  Assessment and Plan: Severe sinus bradycardia, POA -Cardiology consulted -Concerns of 2:1 second degree AV block, needing pacemaker, planned for today   Elevated troponin in the setting of severe bradycardia Troponin 40, 47 No chest pain or sob   Mild high anion gap metabolic acidosis Serum bicarb 21, anion gap 16 Given gentle IVF hydration   Chronic HFpEF Euvolemic   QTc prolongation QTc 534 Avoid QTc prolonging medications        Subjective: Without complaints. Eagerly awaiting pacemaker placement when seen  Physical Exam: Vitals:   11/20/23 1628 11/20/23 1633 11/20/23 1638 11/20/23 1707  BP: 135/70 (!) 147/73 (!) 147/82 (!) 158/66  Pulse: 69 69 71 73  Resp: 18 17 17 16   Temp:    98.3 F (36.8 C)  TempSrc:    Oral  SpO2: 99% 99% 99% 95%  Weight:      Height:       General exam: Awake, laying in bed, in nad Respiratory system: Normal respiratory effort, no wheezing Cardiovascular system: bradycardic, s1, s2 Gastrointestinal system: Soft, nondistended, positive BS Central nervous system: CN2-12 grossly intact, strength intact Extremities: Perfused, no clubbing Skin: Normal skin turgor, no notable skin lesions seen Psychiatry: Mood normal // no visual hallucinations   Data Reviewed:  Labs reviewed: Na 144, K 3.5, Cr 0.89, WBC 6.1, Hgb 11.6, Plts 175  Family Communication: Pt in room, family at bedside  Disposition: Status is: Inpatient Remains inpatient appropriate because: severity of  illness  Planned Discharge Destination: Home    Author: Garnette Pelt, MD 11/20/2023 5:58 PM  For on call review [email or web address].

## 2023-11-20 NOTE — TOC Initial Note (Signed)
 Transition of Care [facility]) - Initial/Assessment Note    Patient Details  Name: Sherry Oconnor MRN: [id number] Date of Birth: 1932/01/13  [facility]) CM/SW Contact:    Sudie Erminio Deems, RN Phone Number: 11/20/2023, 3:02 PM  Clinical Narrative:  Patient presented for bradycardia. PTA patient was from [facility] Patient states she does not use any DME at the facility. Daughter and daughter in law at the bedside during the visit. Patient states she has a PCP and has no issues with transportation. Inpatient Case Manager will continue to follow for additional needs as the patient progresses.      Expected Discharge Plan:  (Independent Living Facility) Barriers to Discharge: Continued Medical Work up   Patient Goals and CMS Choice Patient states their goals for this hospitalization and ongoing recovery are:: Plans to return to [employer].   Choice offered to / list presented to : NA      Expected Discharge Plan and Services In-house Referral: NA Discharge Planning Services: CM Consult Post Acute Care Choice: NA Living arrangements for the past 2 months: Independent Living Facility                   DME Agency: NA                  Prior Living Arrangements/Services Living arrangements for the past 2 months: Independent Living Facility Lives with:: Facility Resident Patient language and need for interpreter reviewed:: Yes Do you feel safe going back to the place where you live?: Yes      Need for Family Participation in Patient Care: Yes (Comment) Care giver support system in place?: Yes (comment)   Criminal Activity/Legal Involvement Pertinent to Current Situation/Hospitalization: No - Comment as needed  Activities of Daily Living   ADL Screening (condition at time of admission) Independently performs ADLs?: Yes (appropriate for developmental age) Is the patient deaf or have  difficulty hearing?: No Does the patient have difficulty seeing, even when wearing glasses/contacts?: No Does the patient have difficulty concentrating, remembering, or making decisions?: No  Permission Sought/Granted Permission sought to share information with : Family Supports, Case Manager                Emotional Assessment Appearance:: Appears stated age Attitude/Demeanor/Rapport: Engaged Affect (typically observed): Appropriate Orientation: : Oriented to Self, Oriented to Place, Oriented to  Time Alcohol / Substance Use: Not Applicable Psych Involvement: No (comment)  Admission diagnosis:  Bradycardia [R00.1] Elevated troponin [R79.89] Symptomatic bradycardia [R00.1] Patient Active Problem List   Diagnosis Date Noted   Bradycardia 11/19/2023   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 [place] [address] Phone: [phone] Fax: [phone]     Social Drivers of Health (SDOH) Social History: SDOH Screenings   Food Insecurity: No Food Insecurity (11/20/2023)  Housing: Low Risk  (11/20/2023)  Transportation Needs: No Transportation Needs (11/20/2023)  Utilities: Not At Risk (11/20/2023)  Social Connections: Moderately Integrated (11/20/2023)  Tobacco Use: Low Risk  (11/20/2023)   SDOH Interventions:  Readmission Risk Interventions     No data to display

## 2023-11-20 NOTE — H&P (Signed)
 History and Physical  Arturo Freundlich FMW:[id number] DOB: 09-Feb-1932 DOA: 11/19/2023  Referring physician: Dr. Charlton TR[facility], hospitalist service. PCP: Teresa Channel, MD  Outpatient Specialists: Cardiology. Patient coming from: Home through [facility] ED.  Chief Complaint: Low heart rate.  HPI: Sherry Oconnor is a 89 y.o. female with medical history significant for chronic LBBB, hypertension, hyperlipidemia, type 2 diabetes with diabetic polyneuropathy, who presented to [facility] ED with complaints of low heart rate for the past 10 days.  She has been checking her blood pressure at home every day.  10 days ago, her machine did not register her pulse because it was too low.  She presented to her PCP who confirmed the bradycardia.  She was referred to cardiology.  Zio patch was ordered but has not been delivered to her home yet.  Endorses associated generalized fatigue and exertional shortness of breath, worse with walking up the steps.  Denies syncope or dizziness.  No reported chest pain, fevers, or chills.  In the ER, bradycardic with heart rate in the 30s.  A twelve-lead EKG revealed sinus bradycardia at a rate of 40 with prolonged QTc 534.  EDP discussed the case with cardiology on-call Dr. Shlomo who will see the patient in the morning.  The patient was accepted by Dr. Charlton, and transferred to [facility] progressive care unit as inpatient status.  ED Course: Temperature 98.2. BP 111/56, pulse 39, respiratory 20, O2 saturation 99% on room air.  Lab studies notable for serum bicarb 21, glucose 138, anion gap 16, GFR 56, troponin 40, 47.  Review of Systems: Review of systems as noted in the HPI. All other systems reviewed and are negative.   Past Medical History:  Diagnosis Date   Allergies    Arthritis    BRCA2 positive 07/17/2017   Pathogenic BRCA2 mutation [id number] (p.[id number]) @ [facility]   Carotid bruit    Right (Doppler done in 2/19   Diabetes mellitus     Diverticulosis    Family history of BRCA2 gene positive    BRCA2 mutation in daughter   Genetic testing 07/17/2017   BRCA2 analysis @ [facility] - Familial pathogenic BRCA2 mutation detected   Hoarseness of voice    Chronic   Hypercholesteremia    Hypertension    Multiple thyroid  nodules    Skin cancer    Head   Spasmodic dysphonia    multinodular goiter   Vitamin B12 deficiency    Vitamin D deficiency    Past Surgical History:  Procedure Laterality Date   ABDOMINAL HYSTERECTOMY     BIOPSY THYROID      CHOLECYSTECTOMY     COLONOSCOPY     excision of skin cancer     Head   GALLBLADDER SURGERY     HERNIA REPAIR Right    LIPOMA EXCISION     OOPHORECTOMY     REVERSE SHOULDER ARTHROPLASTY Left 09/13/2020   Procedure: REVERSE SHOULDER ARTHROPLASTY;  Surgeon: Dozier Soulier, MD;  Location: [facility] ORS;  Service: Orthopedics;  Laterality: Left;   VENTRAL HERNIA REPAIR Right 01/20/2019   Procedure: LAPAROSCOPIC RIGHT SPIGELAIN HERNIA REPAIR WITH MESH;  Surgeon: Gladis Cough, MD;  Location: [facility] ORS;  Service: General;  Laterality: Right;    Social History:  reports that she has never smoked. She has never used smokeless tobacco. She reports that she does not drink alcohol and does not use drugs.   Allergies  Allergen Reactions   Darvon     Upset stomach    Septra [Bactrim] Nausea And  Vomiting   Sulfa Drugs Cross Reactors Nausea And Vomiting   Zithromax [Azithromycin] Nausea And Vomiting   Codeine Nausea Only   Latex     sores   Naproxen Nausea Only   Tessalon Perles [Benzonatate] Diarrhea    Family History  Problem Relation Age of Onset   Heart attack Mother    Heart disease Mother    Coronary artery disease Mother    Pneumonia Father    Hypertension Brother    CAD Brother    Colon cancer Brother    Peripheral Artery Disease Brother    Deep vein thrombosis Maternal Grandmother    Breast cancer Daughter 48       Negative BRCA1/BRCA2   Pancreatic cancer Daughter     Breast cancer Daughter 64       pancreatic ca at 68; BRCA2 mutation      Prior to Admission medications   Medication Sig Start Date End Date Taking? Authorizing Provider  B Complex Vitamins (VITAMIN B COMPLEX PO) Take by mouth daily.    [provider]  calcium carbonate (TUMS - DOSED IN MG ELEMENTAL CALCIUM) 500 MG chewable tablet Chew 500 mg by mouth daily as needed for indigestion or heartburn.    [provider]  fluticasone (FLONASE) 50 MCG/ACT nasal spray Place 2 sprays into the nose 2 (two) times daily as needed for allergies.     [provider]  gabapentin (NEURONTIN) 300 MG capsule Take 300 mg by mouth daily.    [provider]  HYDROcodone -acetaminophen  (NORCO/VICODIN) 5-325 MG tablet Take 1-2 tablets by mouth every 6 (six) hours as needed for moderate pain. 09/13/20   Porterfield, Hospital doctor, PA-C  Lancets (ONETOUCH DELICA PLUS [place]) MISC as needed. 01/09/23   [provider]  lisinopril (ZESTRIL) 10 MG tablet Take 10 mg by mouth daily.    [provider]  loratadine (CLARITIN) 10 MG tablet Take 10 mg by mouth daily. 12/21/20   [provider]  metFORMIN (GLUCOPHAGE-XR) 750 MG 24 hr tablet Take 1,500 mg by mouth daily with supper.     [provider]  ondansetron  (ZOFRAN ) 4 MG tablet Take 1 tablet (4 mg total) by mouth daily as needed for nausea or vomiting. 09/13/20   Porterfield, Amber, PA-C  ONETOUCH ULTRA test strip as needed. 01/09/23   [provider]  pravastatin (PRAVACHOL) 80 MG tablet Take 80 mg by mouth at bedtime.     [provider]  sucralfate  (CARAFATE ) 1 g tablet Take 1 tablet (1 g total) by mouth 4 (four) times daily -  with meals and at bedtime. 05/17/23   Charlyn Sora, MD  tiZANidine  (ZANAFLEX ) 2 MG tablet Take 1 tablet (2 mg total) by mouth every 8 (eight) hours as needed for muscle spasms. 09/13/20   Porter[place], Hospital doctor, PA-C  vitamin B-12 (CYANOCOBALAMIN ) 1000 MCG tablet Take  1,000 mcg by mouth daily.    [provider]  Vitamin D, Ergocalciferol, (DRISDOL) 1.25 MG (50000 UT) CAPS capsule Take 50,000 Units by mouth every 14 (fourteen) days.    [provider]    Physical Exam: BP (!) 111/56 (BP Location: Right Arm)   Pulse (!) 39   Temp 98.2 F (36.8 C) (Oral)   Resp 20   Ht 5' (1.524 m)   Wt 51.1 kg   SpO2 99%   BMI 21.99 kg/m   General: 89 y.o. year-old female well developed well nourished in no acute distress.  Alert and oriented x3. Cardiovascular: Bradycardic with  no rubs or gallops.  No thyromegaly or JVD noted.  No lower extremity edema. 2/4 pulses in all 4 extremities. Respiratory: Clear to auscultation with no wheezes or rales. Good inspiratory effort. Abdomen: Soft nontender nondistended with normal bowel sounds x4 quadrants. Muskuloskeletal: No cyanosis, clubbing or edema noted bilaterally Neuro: CN II-XII intact, strength, sensation, reflexes Skin: No ulcerative lesions noted or rashes Psychiatry: Judgement and insight appear normal. Mood is appropriate for condition and setting          Labs on Admission:  Basic Metabolic Panel: Recent Labs  Lab 11/19/23 2115  NA 144  K 3.7  CL 107  CO2 21*  GLUCOSE 138*  BUN 18  CREATININE 0.96  CALCIUM 10.0  MG 1.9   Liver Function Tests: No results for input(s): AST, ALT, ALKPHOS, BILITOT, PROT, ALBUMIN in the last 168 hours. No results for input(s): LIPASE, AMYLASE in the last 168 hours. No results for input(s): AMMONIA in the last 168 hours. CBC: Recent Labs  Lab 11/19/23 2115  WBC 7.6  NEUTROABS 3.3  HGB 13.1  HCT 38.6  MCV 91.0  PLT 203   Cardiac Enzymes: No results for input(s): CKTOTAL, CKMB, CKMBINDEX, TROPONINI in the last 168 hours.  BNP (last 3 results) No results for input(s): BNP in the last 8760 hours.  ProBNP (last 3 results) No results for input(s): PROBNP in the last 8760 hours.  CBG: No results for input(s):  GLUCAP in the last 168 hours.  Radiological Exams on Admission: No results found.  EKG: I independently viewed the EKG done and my findings are as followed: Sinus bradycardia, rate of 40, QTc 534.  Assessment/Plan Present on Admission:  Bradycardia  Principal Problem:   Bradycardia  Severe sinus bradycardia, POA Cardiology consulted and will see in the morning Currently stable, did not receive atropine, continue to closely monitor on telemetry Continue to hold off any AV nodal blockers Follow TSH and transthoracic echo  Elevated troponin in the setting of severe bradycardia Troponin 40, 47 No reported anginal symptoms. Continue to monitor on telemetry  Mild high anion gap metabolic acidosis Serum bicarb 21, anion gap 16 Gentle IV fluid hydration Repeat BMP in the morning  Chronic HFpEF Euvolemic  QTc prolongation QTc 534 Avoid QTc prolonging medications Optimize magnesium and potassium levels. Closely monitor on telemetry    Time: 75 minutes.   DVT prophylaxis: Subcu heparin  3 times daily  Code Status: Full code.  Family Communication: None at bedside.  Disposition Plan: Admitted to progressive care unit.  Consults called: Cardiology.  Admission status: Inpatient status.   Status is: Inpatient The patient requires at least 2 midnights for further evaluation and treatment of present condition.   Terry LOISE Hurst MD [facility] Pager [phone]  If 7PM-7AM, please contact night-coverage [email or web address] Password [email or web address]  11/20/2023, 12:31 AM

## 2023-11-20 NOTE — Hospital Course (Signed)
 89 y.o. female with medical history significant for chronic LBBB, hypertension, hyperlipidemia, type 2 diabetes with diabetic polyneuropathy, who presented to [facility] ED with complaints of low heart rate for the past 10 days. Pt was admitted for further w/u of bradycardia. Cardiology was consulted

## 2023-11-20 NOTE — Plan of Care (Signed)

## 2023-11-20 NOTE — Consult Note (Addendum)
 Cardiology Consultation   Patient ID: [name] MRN: [id number]; DOB: [birth year]  Admit date: 11/19/2023 Date of Consult: 11/20/2023  PCP:  Teresa Channel, MD   [facility] Providers Cardiologist:  Vina Gull, MD    Patient Profile: Sherry Oconnor is a 89 y.o. female with a hx of  HTN, HLD, DM Spastic larynx w/voice tremor LBBB   who is being seen 11/20/2023 for the evaluation of advanced heart block at the request of Dr Cindy.  History of Present Illness: Ms. Mclear saw the cardiology team APP 09/25/23, doing well, twinges of atypical CP, no changes were made  Recently she called with observations of slow HRs towards the 30s and monitoring was arranged.  She was admitted overnight with reports progressive fatigue and persistently low heart rates On arrival her HR  here also in the 30's-40's, initially felt to be SB. On cardiology evaluation this mroning noted 2:1 AV block and asked EP to the case, given no nodal blocking agents on board/reversible causes.   LABS K+ 3.7, 3.5 Mag 1.9 BUN/Creat 13/0.89 HS Trop 40, 47 WBC 6.2 H/H 11.6/34 Plts 175 TSH 5.501  She reports for a few days feeling funny, off, perhaps, nt right. No near syncope or syncope No CP Her son reports a weak spell a few weeks ago that they took her to the ER for, once there nothing particular found.  Past Medical History:  Diagnosis Date   Allergies    Arthritis    BRCA2 positive 07/17/2017   Pathogenic BRCA2 mutation [id number] (p.[id number]*15) @ [facility]   Carotid bruit    Right (Doppler done in 2/19   Diabetes mellitus    Diverticulosis    Family history of BRCA2 gene positive    BRCA2 mutation in daughter   Genetic testing 07/17/2017   BRCA2 analysis @ [facility] - Familial pathogenic BRCA2 mutation detected   Hoarseness of voice    Chronic   Hypercholesteremia    Hypertension    Multiple thyroid  nodules    Skin cancer    Head   Spasmodic dysphonia    multinodular  goiter   Vitamin B12 deficiency    Vitamin D deficiency     Past Surgical History:  Procedure Laterality Date   ABDOMINAL HYSTERECTOMY     BIOPSY THYROID      CHOLECYSTECTOMY     COLONOSCOPY     excision of skin cancer     Head   GALLBLADDER SURGERY     HERNIA REPAIR Right    LIPOMA EXCISION     OOPHORECTOMY     REVERSE SHOULDER ARTHROPLASTY Left 09/13/2020   Procedure: REVERSE SHOULDER ARTHROPLASTY;  Surgeon: Dozier Soulier, MD;  Location: [facility];  Service: Orthopedics;  Laterality: Left;   VENTRAL HERNIA REPAIR Right 01/20/2019   Procedure: LAPAROSCOPIC RIGHT SPIGELAIN HERNIA REPAIR WITH MESH;  Surgeon: Gladis Cough, MD;  Location: [facility];  Service: General;  Laterality: Right;     Scheduled Meds:  heparin   5,000 Units Subcutaneous Q8H   Continuous Infusions:  sodium chloride  50 mL/hr at 11/20/23 0339   PRN Meds: acetaminophen , melatonin, polyethylene glycol, prochlorperazine   Allergies:    Allergies  Allergen Reactions   Darvon     Upset stomach    Septra [Bactrim] Nausea And Vomiting   Sulfa Drugs Cross Reactors Nausea And Vomiting   Zithromax [Azithromycin] Nausea And Vomiting   Codeine Nausea Only   Latex     sores   Naproxen Nausea Only   Tessalon Perles [Benzonatate] Diarrhea  Social History:   Social History   Socioeconomic History   Marital status: Widowed    Spouse name: Sudie   Number of children: 3   Years of education: Not on file   Highest education level: Not on file  Occupational History   Occupation: Homemaker  Tobacco Use   Smoking status: Never   Smokeless tobacco: Never  Vaping Use   Vaping status: Never Used  Substance and Sexual Activity   Alcohol use: No   Drug use: No   Sexual activity: Not on file  Other Topics Concern   Not on file  Social History Narrative   Exercise: 5-6 days per week, walks for 1 to 1.5 miles   Lives in a 1 story house   10 grandchildren, 1 great grand, 2nd due 7/22   Social Drivers of  Health   Financial Resource Strain: Not on file  Food Insecurity: No Food Insecurity (11/20/2023)   Hunger Vital Sign    Worried About Running Out of Food in the Last Year: Never true    Ran Out of Food in the Last Year: Never true  Transportation Needs: No Transportation Needs (11/20/2023)   PRAPARE - Administrator, Civil Service (Medical): No    Lack of Transportation (Non-Medical): No  Physical Activity: Not on file  Stress: Not on file  Social Connections: Moderately Integrated (11/20/2023)   Social Connection and Isolation Panel    Frequency of Communication with Friends and Family: More than three times a week    Frequency of Social Gatherings with Friends and Family: More than three times a week    Attends Religious Services: More than 4 times per year    Active Member of [employer] or Organizations: Yes    Attends Banker Meetings: More than 4 times per year    Marital Status: Widowed  Intimate Partner Violence: Not At Risk (11/20/2023)   Humiliation, Afraid, Rape, and Kick questionnaire    Fear of Current or Ex-Partner: No    Emotionally Abused: No    Physically Abused: No    Sexually Abused: No    Family History:   Family History  Problem Relation Age of Onset   Heart attack Mother    Heart disease Mother    Coronary artery disease Mother    Pneumonia Father    Hypertension Brother    CAD Brother    Colon cancer Brother    Peripheral Artery Disease Brother    Deep vein thrombosis Maternal Grandmother    Breast cancer Daughter 70       Negative BRCA1/BRCA2   Pancreatic cancer Daughter    Breast cancer Daughter 69       pancreatic ca at 104; BRCA2 mutation     ROS:  Please see the history of present illness.  All other ROS reviewed and negative.     Physical Exam/Data: Vitals:   11/20/23 0020 11/20/23 0418 11/20/23 0748 11/20/23 0752  BP: (!) 111/56 (!) 149/56 (!) 182/55 (!) 179/64  Pulse: (!) 39 (!) 54 (!) 35 (!) 34  Resp: 20 18 17     Temp:  98.6 F (37 C) 98.3 F (36.8 C)   TempSrc: Oral Oral Oral   SpO2: 99% 95% 97% 97%  Weight: 51.1 kg     Height:        Intake/Output Summary (Last 24 hours) at 11/20/2023 1156 Last data filed at 11/20/2023 0339 Gross per 24 hour  Intake 123.06 ml  Output --  Net 123.06 ml      11/20/2023   12:20 AM 11/19/2023    8:39 PM 09/25/2023    9:08 AM  Last 3 Weights  Weight (lbs) 112 lb 9.6 oz 112 lb 112 lb 6.4 oz  Weight (kg) 51.075 kg 50.803 kg 50.984 kg     Body mass index is 21.99 kg/m.  General:  Well nourished, well developed, in no acute distress HEENT: normal Neck: no JVD Vascular: No carotid bruits Cardiac:  RRR; bradycardic, no murmurs, gallops or rubs Lungs:  CTA b/l, no wheezing, rhonchi or rales  Abd: soft, nontender, no hepatomegaly  Ext: no edema Musculoskeletal:  No deformities Skin: warm and dry  Neuro:  no focal abnormalities noted Psych:  Normal affect   EKG:  The EKG was personally reviewed and demonstrates:    Baseline wandering, 2:1 AVblock, LBBB , LAD, 40bpm  Telemetry:  Telemetry was personally reviewed and demonstrates:   Predominantly 2:1 AVblock, soe intermittent 1:1 conducted beats, infrequent PVCs, V escape beat  Relevant CV Studies:  11/20/23: TTE 1. Abnormal septal motion . Left ventricular ejection fraction, by  estimation, is 55 to 60%. The left ventricle has normal function. There is  mild asymmetric left ventricular hypertrophy of the basal and septal  segments. Left ventricular diastolic  parameters are consistent with Grade I diastolic dysfunction (impaired  relaxation).   2. Left atrial size was mildly dilated.   3. The mitral valve is abnormal. Moderate mitral valve regurgitation.   4. The aortic valve is tricuspid. There is mild calcification of the  aortic valve. Aortic valve regurgitation is trivial. Aortic valve  sclerosis is present, with no evidence of aortic valve stenosis.   5. There is normal pulmonary artery  systolic pressure.   6. The inferior vena cava is normal in size with <50% respiratory  variability, suggesting right atrial pressure of 8 mmHg.   Laboratory Data: High Sensitivity Troponin:  No results for input(s): TROPONINIHS in the last 720 hours.   Chemistry Recent Labs  Lab 11/19/23 2115 11/20/23 0456  NA 144 144  K 3.7 3.5  CL 107 116*  CO2 21* 24  GLUCOSE 138* 113*  BUN 18 13  CREATININE 0.96 0.89  CALCIUM 10.0 7.8*  MG 1.9  --   GFRNONAA 56* >60  ANIONGAP 16* 4*    No results for input(s): PROT, ALBUMIN, AST, ALT, ALKPHOS, BILITOT in the last 168 hours. Lipids No results for input(s): CHOL, TRIG, HDL, LABVLDL, LDLCALC, CHOLHDL in the last 168 hours.  Hematology Recent Labs  Lab 11/19/23 2115 11/20/23 0456  WBC 7.6 6.1  RBC 4.24 3.73*  HGB 13.1 11.6*  HCT 38.6 34.4*  MCV 91.0 92.2  MCH 30.9 31.1  MCHC 33.9 33.7  RDW 12.3 12.2  PLT 203 175   Thyroid   Recent Labs  Lab 11/20/23 0456  TSH 5.501*    BNPNo results for input(s): BNP, PROBNP in the last 168 hours.  DDimer No results for input(s): DDIMER in the last 168 hours.  Radiology/Studies:     Assessment and Plan:  Advanced heart block, 2:1 AV block Baseline conduction system disease No reversible causes  Dr. Inocencio has been bedside Discussed recommendation for PPM, rational, the procedure, risks/benefits, she is agreeable to proceed (Son and daughter are bedside)    For questions or updates, please contact [facility] Please consult [email or web address] for contact info under   Signed, Charlies Macario Arthur, PA-C  11/20/2023 11:56 AM   Dickey Puna was seen  by me today along with Charlies Arthur. I have personally performed an evaluation on this patient.  My findings are as follows: 89 y.o. female with a past history as above.  She presented to the hospital after she noted that her heart rate was slow.  She has been feeling weak and fatigued over the last  few weeks.  She presented to her primary physician who sent her to the emergency room.  The emergency room, she was found to be in 2-1 AV block.  She is on no AV nodal blockers.  She feels well when she is at rest but does have symptoms when she is exerting herself..  Data: EKG(s) and pertinent labs, studies, etc were personally reviewed and interpreted by me:  EKG, telemetry, labs Otherwise, I agree with data as outlined by the advanced practice provider.  Exam performed by me: Gen: No acute distress Neck: None JVD Cardiac: Bradycardic Lungs: Normal work of breathing Extremities: No edema  My Assessment and Plan:  1.  2-1 second-degree AV block: No reversible causes found.  She Javaria Knapke need pacemaker implant.  Risks and benefits of pacemaker implant were discussed with the patient and family.  The patient and family understand the risks and have agreed to the procedure.  Explained risks, benefits, and alternatives to PPM implantation, including but not limited to bleeding, infection, pneumothorax, pericardial effusion, lead dislodgement, heart attack, stroke, or death.  Pt verbalized understanding and agrees to proceed.   Signed,  Alie Moudy Gladis Norton, MD  11/20/2023 3:20 PM

## 2023-11-20 NOTE — Discharge Instructions (Signed)
 After Your Pacemaker   You have a Abbott Pacemaker  If you have a Medtronic or [employer]ron[employer] device, plug in your home monitor once you get home, and no manual interaction is required.   If you have an [employer] or [employer] device, plug your home monitor once you get home, sit near the device, and press the large activation button. Sit nearby until the process is complete, usually notated by lights on the monitor.   If you were set up for monitoring using an app on your phone, make sure the app remains open in the background and the Bluetooth remains on.  ACTIVITY Do not lift your arm above shoulder height for 1 week after your procedure. After 7 days, you may progress as below.  You should remove your sling 24 hours after your procedure, unless otherwise instructed by your provider.     Friday November 27, 2023  Saturday November 28, 2023 Sunday November 29, 2023 Monday November 30, 2023   Do not lift, push, pull, or carry anything over 10 pounds with the affected arm until 6 weeks (Friday January 01, 2024 ) after your procedure.   You may drive AFTER your wound check, unless you have been told otherwise by your provider.   Ask your healthcare provider when you can go back to work   INCISION/Dressing If you are on a blood thinner such as Coumadin, Xarelto, Eliquis, Plavix, or Pradaxa please confirm with your provider when this should be resumed.   If large square, outer bandage is left in place, this can be removed after 24 hours from your procedure. Do not remove steri-strips or glue as below.   If a PRESSURE DRESSING (a bulky dressing that usually goes up over your shoulder) was applied or left in place, please follow instructions given by your provider on when to return to have this removed.   Monitor your Pacemaker site for redness, swelling, and drainage. Call the device clinic at [phone] if you experience these symptoms or fever/chills.  If your incision is sealed  with Steri-strips or staples, you may shower 7 days after your procedure or when told by your provider. Do not remove the steri-strips or let the shower hit directly on your site. You may wash around your site with soap and water .    If you were discharged in a sling, please do not wear this during the day more than 48 hours after your surgery unless otherwise instructed. This may increase the risk of stiffness and soreness in your shoulder.   Avoid lotions, ointments, or perfumes over your incision until it is well-healed.  You may use a hot tub or a pool AFTER your wound check appointment if the incision is completely closed.  Pacemaker Alerts:  Some alerts are vibratory and others beep. These are NOT emergencies. Please call our office to let us  know. If this occurs at night or on weekends, it can wait until the next business day. Send a remote transmission.  If your device is capable of reading fluid status (for heart failure), you will be offered monthly monitoring to review this with you.   DEVICE MANAGEMENT Remote monitoring is used to monitor your pacemaker from home. This monitoring is scheduled every 91 days by our office. It allows us  to keep an eye on the functioning of your device to ensure it is working properly. You will routinely see your Electrophysiologist annually (more often if necessary).  This will appear as a REMOTE check on your  MyChart schedule. These are automatic and there is nothing for you to manually do unless otherwise instructed.  You should receive your ID card for your new device in 4-8 weeks. Keep this card with you at all times once received. Consider wearing a medical alert bracelet or necklace.  Your Pacemaker may be MRI compatible. This will be discussed at your next office visit/wound check.  You should avoid contact with strong electric or magnetic fields.   Do not use amateur (ham) radio equipment or electric (arc) welding torches. MP3 player headphones  with magnets should not be used. Some devices are safe to use if held at least 12 inches (30 cm) from your Pacemaker. These include power tools, lawn mowers, and speakers. If you are unsure if something is safe to use, ask your health care provider.  When using your cell phone, hold it to the ear that is on the opposite side from the Pacemaker. Do not leave your cell phone in a pocket over the Pacemaker.  You may safely use electric blankets, heating pads, computers, and microwave ovens.  Call the office right away if: You have chest pain. You feel more short of breath than you have felt before. You feel more light-headed than you have felt before. Your incision starts to open up.  This information is not intended to replace advice given to you by your health care provider. Make sure you discuss any questions you have with your health care provider.

## 2023-11-20 NOTE — Progress Notes (Signed)
  Echocardiogram 2D Echocardiogram has been performed.  Koleen KANDICE Popper, RDCS 11/20/2023, 11:29 AM

## 2023-11-21 ENCOUNTER — Other Ambulatory Visit (HOSPITAL_COMMUNITY): Payer: Self-pay

## 2023-11-21 ENCOUNTER — Inpatient Hospital Stay (HOSPITAL_COMMUNITY)

## 2023-11-21 DIAGNOSIS — R7989 Other specified abnormal findings of blood chemistry: Secondary | ICD-10-CM | POA: Diagnosis not present

## 2023-11-21 DIAGNOSIS — R001 Bradycardia, unspecified: Secondary | ICD-10-CM | POA: Diagnosis not present

## 2023-11-21 LAB — GLUCOSE, CAPILLARY
Glucose-Capillary: 175 mg/dL — ABNORMAL HIGH (ref 70–99)
Glucose-Capillary: 117 mg/dL — ABNORMAL HIGH (ref 70–99)

## 2023-11-21 LAB — HEMOGLOBIN A1C
Hgb A1c MFr Bld: 6.1 % — ABNORMAL HIGH (ref 4.8–5.6)
Mean Plasma Glucose: 128.37 mg/dL

## 2023-11-21 MED ORDER — METFORMIN HCL 500 MG PO TABS
500.0000 mg | ORAL_TABLET | Freq: Two times a day (BID) | ORAL | 0 refills | Status: AC
  Filled 2023-11-21: qty 60, 30d supply, fill #0

## 2023-11-21 NOTE — Plan of Care (Signed)
  Problem: Education: Goal: Knowledge of General Education information will improve Description: Including pain rating scale, medication(s)/side effects and non-pharmacologic comfort measures Outcome: Progressing   Problem: Health Behavior/Discharge Planning: Goal: Ability to manage health-related needs will improve Outcome: Progressing   Problem: Elimination: Goal: Will not experience complications related to bowel motility Outcome: Progressing Goal: Will not experience complications related to urinary retention Outcome: Progressing   Problem: Coping: Goal: Level of anxiety will decrease Outcome: Progressing

## 2023-11-21 NOTE — Progress Notes (Signed)
  Progress Note  Patient Name: Sherry Oconnor Date of Encounter: 11/21/2023 [facility] Cardiologist: Vina Gull, MD   Interval Summary   Patient feeling well without acute complaint.  Pacemaker implant yesterday without issue.  Vital Signs Vitals:   11/20/23 2005 11/21/23 0006 11/21/23 0405 11/21/23 0805  BP: 139/60 (!) 108/49 (!) 127/53 (!) 147/63  Pulse: 71 61 62 68  Resp: 16 18 15 16   Temp: 98.5 F (36.9 C) 98.4 F (36.9 C) 98.9 F (37.2 C) 98.7 F (37.1 C)  TempSrc: Oral Oral Oral Oral  SpO2: 95% 96% 96% 94%  Weight:      Height:        Intake/Output Summary (Last 24 hours) at 11/21/2023 0826 Last data filed at 11/20/2023 1830 Gross per 24 hour  Intake 980.82 ml  Output --  Net 980.82 ml      11/20/2023   12:20 AM 11/19/2023    8:39 PM 09/25/2023    9:08 AM  Last 3 Weights  Weight (lbs) 112 lb 9.6 oz 112 lb 112 lb 6.4 oz  Weight (kg) 51.075 kg 50.803 kg 50.984 kg      Telemetry/ECG  Sinus rhythm, ventricular paced- Personally Reviewed  Physical Exam  GEN: No acute distress.   Neck: No JVD Cardiac: RRR, no murmurs, rubs, or gallops.  Respiratory: Clear to auscultation bilaterally. GI: Soft, nontender, non-distended  MS: No edema  Assessment & Plan  1.  Second-degree AV block: Patient is post Abbott dual-chamber pacemaker implanted yesterday.  Device functioning appropriately.  At this point, would be okay for discharge.  She has follow-up arranged.  Senna Lape have her work with physical therapy prior to discharge. [facility] Garmon Dehn sign off.   The patient is ready for discharge today from a cardiac standpoint. Medication Recommendations: None new Other recommendations (labs, testing, etc):   Follow up as an outpatient: Has been arranged For questions or updates, please contact [facility] Please consult [email or web address] for contact info under         Signed, Kymari Nuon Gladis Norton, MD

## 2023-11-21 NOTE — Plan of Care (Signed)
  Problem: Education: Goal: Knowledge of General Education information will improve Description: Including pain rating scale, medication(s)/side effects and non-pharmacologic comfort measures Outcome: Adequate for Discharge   Problem: Health Behavior/Discharge Planning: Goal: Ability to manage health-related needs will improve Outcome: Adequate for Discharge   Problem: Clinical Measurements: Goal: Ability to maintain clinical measurements within normal limits will improve Outcome: Adequate for Discharge Goal: Will remain free from infection Outcome: Adequate for Discharge Goal: Diagnostic test results will improve Outcome: Adequate for Discharge Goal: Respiratory complications will improve Outcome: Adequate for Discharge Goal: Cardiovascular complication will be avoided Outcome: Adequate for Discharge   Problem: Activity: Goal: Risk for activity intolerance will decrease Outcome: Adequate for Discharge   Problem: Nutrition: Goal: Adequate nutrition will be maintained Outcome: Adequate for Discharge   Problem: Coping: Goal: Level of anxiety will decrease Outcome: Adequate for Discharge   Problem: Elimination: Goal: Will not experience complications related to bowel motility Outcome: Adequate for Discharge Goal: Will not experience complications related to urinary retention Outcome: Adequate for Discharge   Problem: Pain Managment: Goal: General experience of comfort will improve and/or be controlled Outcome: Adequate for Discharge   Problem: Safety: Goal: Ability to remain free from injury will improve Outcome: Adequate for Discharge   Problem: Skin Integrity: Goal: Risk for impaired skin integrity will decrease Outcome: Adequate for Discharge   Problem: Education: Goal: Knowledge of cardiac device and self-care will improve Outcome: Adequate for Discharge Goal: Ability to safely manage health related needs after discharge will improve Outcome: Adequate for  Discharge Goal: Individualized Educational Video(s) Outcome: Adequate for Discharge   Problem: Cardiac: Goal: Ability to achieve and maintain adequate cardiopulmonary perfusion will improve Outcome: Adequate for Discharge   Problem: Education: Goal: Ability to describe self-care measures that may prevent or decrease complications (Diabetes Survival Skills Education) will improve Outcome: Adequate for Discharge Goal: Individualized Educational Video(s) Outcome: Adequate for Discharge   Problem: Coping: Goal: Ability to adjust to condition or change in health will improve Outcome: Adequate for Discharge   Problem: Fluid Volume: Goal: Ability to maintain a balanced intake and output will improve Outcome: Adequate for Discharge   Problem: Health Behavior/Discharge Planning: Goal: Ability to identify and utilize available resources and services will improve Outcome: Adequate for Discharge Goal: Ability to manage health-related needs will improve Outcome: Adequate for Discharge   Problem: Metabolic: Goal: Ability to maintain appropriate glucose levels will improve Outcome: Adequate for Discharge   Problem: Nutritional: Goal: Maintenance of adequate nutrition will improve Outcome: Adequate for Discharge Goal: Progress toward achieving an optimal weight will improve Outcome: Adequate for Discharge   Problem: Skin Integrity: Goal: Risk for impaired skin integrity will decrease Outcome: Adequate for Discharge   Problem: Tissue Perfusion: Goal: Adequacy of tissue perfusion will improve Outcome: Adequate for Discharge

## 2023-11-21 NOTE — Discharge Summary (Signed)
 Physician Discharge Summary   Patient: Sherry Oconnor MRN: [id number] DOB: Jul 03, 1931  Admit date:     11/19/2023  Discharge date: 11/21/23  Discharge Physician: Garnette Pelt   PCP: Teresa Channel, MD   Recommendations at discharge:    Follow up with PCP in 1-2 weeks Follow up with Cardiology as scheduled  Discharge Diagnoses: Principal Problem:   Bradycardia  Resolved Problems:   * No resolved hospital problems. *  Hospital Course:  89 y.o. female with medical history significant for chronic LBBB, hypertension, hyperlipidemia, type 2 diabetes with diabetic polyneuropathy, who presented to [facility] ED with complaints of low heart rate for the past 10 days. Pt was admitted for further w/u of bradycardia. Cardiology was consulted  Assessment and Plan: Severe sinus bradycardia, POA -Cardiology consulted -Concerns of 2:1 second degree AV block, needing pacemaker, Pt underwent pacemaker placement 9/19 by EP   Elevated troponin in the setting of severe bradycardia Troponin 40, 47 No chest pain or sob   Mild high anion gap metabolic acidosis Serum bicarb 21, anion gap 16 Given gentle IVF hydration   Chronic HFpEF Euvolemic   QTc prolongation QTc 534 Avoid QTc prolonging medications  Hyperglycemia -Random glucose noted to be 215 this visit -recommend metformin  500mg  bid for now        Consultants: Cardiology Procedures performed: Pacemaker placement  Disposition: Home Diet recommendation:  Cardiac and Carb modified diet DISCHARGE MEDICATION: Allergies as of 11/21/2023       Reactions   Septra [bactrim] Nausea And Vomiting   Sulfa Drugs Cross Reactors Nausea And Vomiting   Zithromax [azithromycin] Nausea And Vomiting   Aleve [naproxen] Nausea Only   Carafate  [sucralfate ] Other (See Comments)   Constipation    Codeine Nausea Only   Darvon-n [propoxyphene] Other (See Comments)   Stomach pain   Tessalon Perles [benzonatate] Diarrhea   Latex Rash, Other (See  Comments)   Sores secondary        Medication List     TAKE these medications    fluticasone 50 MCG/ACT nasal spray Commonly known as: FLONASE Place 2 sprays into the nose daily as needed for allergies.   gabapentin 300 MG capsule Commonly known as: NEURONTIN Take 300 mg by mouth at bedtime.   lisinopril 10 MG tablet Commonly known as: ZESTRIL Take 10 mg by mouth daily.   loratadine 10 MG tablet Commonly known as: CLARITIN Take 10 mg by mouth daily.   metFORMIN  500 MG tablet Commonly known as: GLUCOPHAGE  Take 1 tablet (500 mg total) by mouth 2 (two) times daily with a meal.   pravastatin 80 MG tablet Commonly known as: PRAVACHOL Take 80 mg by mouth at bedtime.   TUMS PO Take 2 tablets by mouth 3 (three) times daily as needed (heartburn, indigestion).   VITAMIN B COMPLEX PO Take 1 tablet by mouth daily with lunch.   VITAMIN B-12 PO Take 1 tablet by mouth See admin instructions. Take 1 tablet by mouth with lunch every other day.   VITAMIN D-3 PO Take 1 capsule by mouth daily with lunch.        Follow-up Information     Teresa Channel, MD Follow up in 2 week(s).   Specialty: Family Medicine Why: Hospital follow up Contact information: [address] [place] [address] [phone]         Inocencio Soyla Lunger, MD Follow up.   Specialty: Cardiology Why: as scheduled Contact information: [address] [place] [address] [phone]  Discharge Exam: Filed Weights   11/19/23 2039 11/20/23 0020  Weight: 50.8 kg 51.1 kg   General exam: Awake, laying in bed, in nad Respiratory system: Normal respiratory effort, no wheezing Cardiovascular system: regular rate, s1, s2 Gastrointestinal system: Soft, nondistended, positive BS Central nervous system: CN2-12 grossly intact, strength intact Extremities: Perfused, no clubbing Skin: Normal skin turgor, no notable skin lesions seen Psychiatry: Mood  normal // no visual hallucinations    Condition at discharge: fair  The results of significant diagnostics from this hospitalization (including imaging, microbiology, ancillary and laboratory) are listed below for reference.   Imaging Studies: DG Chest 2 View Result Date: 11/21/2023 CLINICAL DATA:  Pacemaker placement. EXAM: CHEST - 2 VIEW COMPARISON:  05/17/2023 FINDINGS: Left-sided dual lead permanent pacemaker is new in the interval. No pneumothorax or substantial pleural effusion. No pulmonary edema or focal airspace consolidation. Nodular density over the lower right lung is stable in the interval, better evaluated by CT scan on 08/17/2022 and characterized as favoring a benign pulmonary hamartoma. Status post left shoulder replacement. Telemetry leads overlie the chest. IMPRESSION: Interval placement of left-sided dual lead permanent pacemaker without pneumothorax or pleural effusion. Electronically Signed   By: Camellia Candle M.D.   On: 11/21/2023 08:03   EP PPM/ICD IMPLANT Result Date: 11/20/2023 SURGEON:  Soyla Norton, MD   PREPROCEDURE DIAGNOSIS:  second degree AV block   POSTPROCEDURE DIAGNOSIS:  second degree AV block    PROCEDURES:  1. Pacemaker implantation.   INTRODUCTION:  Sherry Oconnor is a 89 y.o. female with a history of bradycardia who presents today for pacemaker implantation.  The patient reports intermittent episodes of dizziness over the past few months.  No reversible causes have been identified.  The patient therefore presents today for pacemaker implantation.   DESCRIPTION OF PROCEDURE:  Informed written consent was obtained, and  the patient was brought to the electrophysiology lab in a fasting state.  The patient required no sedation for the procedure today.  The patients left chest was prepped and draped in the usual sterile fashion by the EP lab staff. The skin overlying the left deltopectoral region was infiltrated with lidocaine  for local analgesia.  A 4-cm incision was  made over the left deltopectoral region.  A left subcutaneous pacemaker pocket was fashioned using a combination of sharp and blunt dissection. Electrocautery was required to assure hemostasis.  RA/RV Lead Placement: The left axillary vein was therefore cannulated.  Through the left axillary vein, a Abbott Ultipace 1231-52  (serial number  [id number]) right atrial lead and an Abbott Ultipace 1231-65 (serial number  [id number]) right ventricular lead were advanced with fluoroscopic visualization into the right atrial appendage and left bundle area positions respectively.  Initial atrial lead P- waves measured 6.1 mV with impedance of 500 ohms and a threshold of 1.2 V at 0.5 msec.  Right ventricular lead R-waves measured 18.8 mV with an impedance of 740 ohms and a threshold of 0.6 V at 0.5 msec.  Both leads were secured to the pectoralis fascia using #2-0 silk over the suture sleeves. Device Placement:  The leads were then connected to an Abbott Assurity [id number]  (serial number  [id number] ) pacemaker.  The pocket was irrigated with copious gentamicin  solution.  The pacemaker was then placed into the pocket.  The pocket was then closed in 3 layers with 2.0 and 3.0 V-Loc suture for the subcutaneous layers and 3.0 Vicryl suture for the subcuticular layers.  Steri-  Strips and a sterile dressing were then  applied. EBL<94ml.  There were no early apparent complications.   CONCLUSIONS:  1. Successful implantation of a Abbott Assurity [id number] dual-chamber pacemaker for symptomatic bradycardia  2. No early apparent complications.   Will Inocencio, MD 11/20/2023 4:36 PM  ECHOCARDIOGRAM LIMITED Result Date: 11/20/2023    ECHOCARDIOGRAM LIMITED REPORT   Patient Name:   Sherry Oconnor Date of Exam: 11/20/2023 Medical Rec #:  [id number]    Height:       60.0 in Accession #:    [id number]   Weight:       112.6 lb Date of Birth:  08-Apr-1931   BSA:          1.462 m Patient Age:    89 years     BP:           149/56 mmHg Patient Gender: F             HR:           51 bpm. Exam Location:  Inpatient Procedure: Limited Echo, Limited Color Doppler, Cardiac Doppler and 2D Echo            (Both Spectral and Color Flow Doppler were utilized during            procedure). Indications:    Abnormal ECG R94.31  History:        Patient has prior history of Echocardiogram examinations, most                 recent 07/20/2023. Arrythmias:Bradycardia; Risk                 Factors:Hypertension and Diabetes.  Sonographer:    Koleen Popper RDCS Referring Phys: [id number] CAROLE N HALL IMPRESSIONS  1. Abnormal septal motion . Left ventricular ejection fraction, by estimation, is 55 to 60%. The left ventricle has normal function. There is mild asymmetric left ventricular hypertrophy of the basal and septal segments. Left ventricular diastolic parameters are consistent with Grade I diastolic dysfunction (impaired relaxation).  2. Left atrial size was mildly dilated.  3. The mitral valve is abnormal. Moderate mitral valve regurgitation.  4. The aortic valve is tricuspid. There is mild calcification of the aortic valve. Aortic valve regurgitation is trivial. Aortic valve sclerosis is present, with no evidence of aortic valve stenosis.  5. There is normal pulmonary artery systolic pressure.  6. The inferior vena cava is normal in size with <50% respiratory variability, suggesting right atrial pressure of 8 mmHg. FINDINGS  Left Ventricle: Abnormal septal motion. Left ventricular ejection fraction, by estimation, is 55 to 60%. The left ventricle has normal function. Strain was performed and the global longitudinal strain is indeterminate. The left ventricular internal cavity size was normal in size. There is mild asymmetric left ventricular hypertrophy of the basal and septal segments. Left ventricular diastolic parameters are consistent with Grade I diastolic dysfunction (impaired relaxation). Right Ventricle: There is normal pulmonary artery systolic pressure. The tricuspid regurgitant  velocity is 2.84 m/s, and with an assumed right atrial pressure of 3 mmHg, the estimated right ventricular systolic pressure is 35.3 mmHg. Left Atrium: Left atrial size was mildly dilated. Pericardium: There is no evidence of pericardial effusion. Mitral Valve: The mitral valve is abnormal. There is mild thickening of the mitral valve leaflet(s). Moderate mitral valve regurgitation. MV peak gradient, 7.0 mmHg. The mean mitral valve gradient is 4.0 mmHg. Tricuspid Valve: The tricuspid valve is normal in structure. Tricuspid valve regurgitation is mild. Aortic Valve: The aortic valve is tricuspid. There is  mild calcification of the aortic valve. Aortic valve regurgitation is trivial. Aortic valve sclerosis is present, with no evidence of aortic valve stenosis. Aorta: The aortic root is normal in size and structure. Venous: The inferior vena cava is normal in size with less than 50% respiratory variability, suggesting right atrial pressure of 8 mmHg. IAS/Shunts: No atrial level shunt detected by color flow Doppler. Additional Comments: 3D was performed not requiring image post processing on an independent workstation and was indeterminate. Spectral Doppler performed. Color Doppler performed.  LEFT VENTRICLE PLAX 2D LVIDd:         4.60 cm LVIDs:         3.00 cm LV PW:         0.70 cm LV IVS:        1.10 cm LVOT diam:     2.00 cm LV SV:         67 LV SV Index:   46 LVOT Area:     3.14 cm  IVC IVC diam: 1.70 cm LEFT ATRIUM         Index LA diam:    4.20 cm 2.87 cm/m  AORTIC VALVE LVOT Vmax:   96.40 cm/s LVOT Vmean:  56.100 cm/s LVOT VTI:    0.214 m  AORTA Ao Root diam: 2.90 cm Ao Asc diam:  3.20 cm MITRAL VALVE                TRICUSPID VALVE MV Area (PHT): 3.27 cm     TR Peak grad:   32.3 mmHg MV Area VTI:   2.48 cm     TR Vmax:        284.00 cm/s MV Peak grad:  7.0 mmHg MV Mean grad:  4.0 mmHg     SHUNTS MV Vmax:       1.32 m/s     Systemic VTI:  0.21 m MV Vmean:      94.4 cm/s    Systemic Diam: 2.00 cm MV Decel  Time: 232 msec MV E velocity: 90.10 cm/s MV A velocity: 104.00 cm/s MV E/A ratio:  0.87 Maude Emmer MD Electronically signed by Maude Emmer MD Signature Date/Time: 11/20/2023/11:40:48 AM    Final     Microbiology: Results for orders placed or performed during the hospital encounter of 11/19/23  MRSA Next Gen by PCR, Nasal     Status: None   Collection Time: 11/20/23 12:24 AM   Specimen: Nasal Mucosa; Nasal Swab  Result Value Ref Range Status   MRSA by PCR Next Gen NOT DETECTED NOT DETECTED Final    Comment: (NOTE) The GeneXpert MRSA Assay (FDA approved for NASAL specimens only), is one component of a comprehensive MRSA colonization surveillance program. It is not intended to diagnose MRSA infection nor to guide or monitor treatment for MRSA infections. Test performance is not FDA approved in patients less than 18 years old. Performed at [facility], [address] [place], [place] [address]   Surgical PCR screen     Status: None   Collection Time: 11/20/23 12:22 PM   Specimen: Nasal Mucosa; Nasal Swab  Result Value Ref Range Status   MRSA, PCR NEGATIVE NEGATIVE Final   Staphylococcus aureus NEGATIVE NEGATIVE Final    Comment: (NOTE) The Xpert SA Assay (FDA approved for NASAL specimens in patients 52 years of age and older), is one component of a comprehensive surveillance program. It is not intended to diagnose infection nor to guide or monitor treatment. Performed at [facility], [address] [place], [place] [address]     Labs: CBC: Recent Labs  Lab 11/19/23 2115 11/20/23 0456  WBC 7.6 6.1  NEUTROABS 3.3  --   HGB 13.1 11.6*  HCT 38.6 34.4*  MCV 91.0 92.2  PLT 203 175   Basic Metabolic Panel: Recent Labs  Lab 11/19/23 2115 11/20/23 0456  NA 144 144  K 3.7 3.5  CL 107 116*  CO2 21* 24  GLUCOSE 138* 113*  BUN 18 13  CREATININE 0.96 0.89  CALCIUM 10.0 7.8*  MG 1.9  --   PHOS  --  3.4   Liver Function Tests: No results for input(s):  AST, ALT, ALKPHOS, BILITOT, PROT, ALBUMIN in the last 168 hours. CBG: Recent Labs  Lab 11/20/23 1305 11/20/23 1734 11/20/23 2112 11/21/23 0829 11/21/23 1130  GLUCAP 123* 146* 215* 175* 117*    Discharge time spent: less than 30 minutes.  Signed: Garnette Pelt, MD [facility] 11/21/2023

## 2023-11-21 NOTE — Evaluation (Signed)
 Physical Therapy Brief Evaluation and Discharge Note Patient Details Name: Sherry Oconnor MRN: [id number] DOB: June 16, 1931 Today's Date: 11/21/2023   History of Present Illness  Pt is 89 yo presenting to [facility] ED with complaints of low heart rate for 10 days. Pt was admitted for further work up of bradycardia. Pt transferred to [facility] for pacemaker placement on 9/19. Pmh: LBBB, HTN, hyperlipidemia, DM II with diabetic polyneuropathy.  Clinical Impression  Pt is presenting at Mod I to ind for sit to stand, gait and transfers without an AD. Pt educated on pacemaker precautions and pt did very well adhering to precautions throughout session. HR 85 bpm after 400 ft of gait at Mod I to Ind with slight increase in BOS intermittently with gait for balance assist. Currently pt is presenting at baseline level of functioning and no skilled physical therapy services recommended. Pt will be discharged from skilled physical therapy services at this time; please re-consult if further needs arise.           PT Assessment Patient does not need any further PT services     Equipment Recommendations None recommended by PT     Precautions/Restrictions Precautions Precautions: ICD/Pacemaker Recall of Precautions/Restrictions: Intact Restrictions Weight Bearing Restrictions Per Provider Order: No        Mobility  Bed Mobility       General bed mobility comments: Pt up in recliner on arrival/departure. RN and pt state she moved well  Transfers Overall transfer level: Modified independent Equipment used: None        Ambulation/Gait Ambulation/Gait assistance: Modified independent (Device/Increase time) Gait Distance (Feet): 400 Feet Assistive device: None Gait Pattern/deviations: Step-through pattern, Decreased stride length Gait Speed: Below normal General Gait Details: no significant deviations  Home Activity Instructions Home Activity Instructions: pt provided with pacemaker  precautions     Balance Overall balance assessment: Modified Independent      Pertinent Vitals/Pain   Pain Assessment Pain Assessment: No/denies pain     Home Living Family/patient expects to be discharged to:: Private residence Living Arrangements: Alone Available Help at Discharge: Family;Available PRN/intermittently Home Environment: Level entry;Other (comment) (elevator)   Home Equipment: Rollator (4 wheels);Cane - single point;Shower seat;Grab bars - toilet;Grab bars - tub/shower;Wheelchair - manual        Prior Function Level of Independence: Independent Comments: occasionally is a furniture walker in the mornings    UE/LE Assessment   UE ROM/Strength/Tone/Coordination: WFL (LUE limitations due to pacemaker)    LE ROM/Strength/Tone/Coordination: [facility]      Communication   Communication Communication: No apparent difficulties     Cognition Overall Cognitive Status: Appears within functional limits for tasks assessed/performed       General Comments General comments (skin integrity, edema, etc.): HR 85 bpm after 400 ft of gait.        Assessment/Plan           No Skilled PT Patient at baseline level of functioning;Patient is modified independent with all activity/mobility;All education completed    AMPAC 6 Clicks Help needed turning from your back to your side while in a flat bed without using bedrails?: None Help needed moving from lying on your back to sitting on the side of a flat bed without using bedrails?: None Help needed moving to and from a bed to a chair (including a wheelchair)?: None Help needed standing up from a chair using your arms (e.g., wheelchair or bedside chair)?: None Help needed to walk in hospital room?: None Help needed climbing 3-5  steps with a railing? : A Little 6 Click Score: 23      End of Session Equipment Utilized During Treatment: Gait belt Activity Tolerance: Patient tolerated treatment well Patient left: in  chair;with call bell/phone within reach;with family/visitor present Nurse Communication: Mobility status       Time: 1011-1025 PT Time Calculation (min) (ACUTE ONLY): 14 min  Charges:   PT Evaluation $PT Eval Low Complexity: 1 Low     Dorothyann Maier, DPT, CLT  Acute Rehabilitation Services Office: [phone] (Secure chat preferred)   Dorothyann VEAR Maier  11/21/2023, 10:40 AM

## 2023-11-22 ENCOUNTER — Encounter (HOSPITAL_COMMUNITY): Payer: Self-pay | Admitting: Cardiology

## 2023-11-23 ENCOUNTER — Ambulatory Visit: Attending: Emergency Medicine | Admitting: Emergency Medicine

## 2023-11-23 ENCOUNTER — Ambulatory Visit: Admitting: Cardiology

## 2023-11-23 MED FILL — Lidocaine HCl Local Inj 1%: INTRAMUSCULAR | Qty: 30 | Status: CN

## 2023-11-23 MED FILL — Lidocaine HCl Local Inj 1%: INTRAMUSCULAR | Qty: 30 | Status: AC

## 2023-11-23 MED FILL — Lidocaine HCl Local Inj 1%: INTRAMUSCULAR | Qty: 20 | Status: AC

## 2023-11-23 NOTE — Progress Notes (Deleted)
 Cardiology Office Note:    Date:  11/23/2023  ID:  Ladajah Soltys, DOB [birth year], MRN [id number] PCP: Teresa Channel, MD  [facility] Providers Cardiologist:  Vina Gull, MD { Click to update primary MD,subspecialty MD or APP then REFRESH:1}    {Click to Open Review  :1}   Patient Profile:       Chief Complaint: *** History of Present Illness:  Sherry Oconnor is a 89 y.o. female with visit-pertinent history of hypertension, T2DM, hyperlipidemia, chronic LBBB, 2:1 second-degree AV block s/p pacemaker on 11/20/2023, chronic HFpEF  Patient was first referred to cardiology in 08/2020 for evaluation of abnormal EKG.  Initially felt to be LBBB but was actually first-degree AV block and IVCD.  EKG was overall nondiagnostic.  Underwent nuclear stress test in June 2022 that was normal, low risk study without evidence of ischemia.  EF was estimated at 77%.  She was seen in the ED on 05/17/2023 for evaluation of chest pains.  High-sensitivity troponins negative x 2.  X-ray showed no active cardiopulmonary disease.  EKG showed NSR, LBBB, possible LVH.  She was seen in clinic post ER visit on 06/16/2023.  At that time she denied any recurrence of chest pain.  She was able to participate in exercise classes without symptoms.  Echocardiogram was ordered that showed LVEF 55%, no RWMA, grade 1 DD, normal RV function, mild MR.  She was last seen in clinic on 09/25/2023.  She was doing well without specific cardiac concerns or complaints.  She had no recurrence of chest pains.  She was to follow-up in 1 year.  Patient was recently seen in the hospital 11/19/2023.  She had presented to the hospital after she noted that her heart rate was slow.  She was feeling weak and fatigued over the past few weeks.  She presented to her primary physician who sent her to the emergency room. On arrival to the ER her heart rates were in the 30s/40s initially felt to be sinus bradycardia.  Cardiology was consulted for evaluation.   Patient was noted to be in 2:1 AV block.  She is on no AV nodal blocking agents.  No reversible causes were found.  She underwent pacemaker placement with Abbott dual-chamber pacemaker.  She was discharged on 11/21/2023.  Discussed the use of AI scribe software for clinical note transcription with the patient, who gave verbal consent to proceed.  History of Present Illness     Review of systems:  Please see the history of present illness. All other systems are reviewed and otherwise negative. ***      Studies Reviewed:        ***  Risk Assessment/Calculations:   {Does this patient have ATRIAL FIBRILLATION?:[phone]} No BP recorded.  {Refresh Note OR Click here to enter BP  :1}***        Physical Exam:   VS:  There were no vitals taken for this visit.   Wt Readings from Last 3 Encounters:  11/20/23 112 lb 9.6 oz (51.1 kg)  09/25/23 112 lb 6.4 oz (51 kg)  06/16/23 112 lb 9.6 oz (51.1 kg)    GEN: Well nourished, well developed in no acute distress NECK: No JVD; No carotid bruits CARDIAC: ***RRR, no murmurs, rubs, gallops RESPIRATORY:  Clear to auscultation without rales, wheezing or rhonchi  ABDOMEN: Soft, non-tender, non-distended EXTREMITIES:  No edema; No acute deformity ***      Assessment and Plan:  2:1 second-degree AV block S/p PPM on 9/19  Chronic HFpEF  Hypertension  Hyperlipidemia Assessment & Plan      {Are you ordering a CV Procedure (e.g. stress test, cath, DCCV, TEE, etc)?   Press F2        :[id number]}  Dispo:  No follow-ups on file.  Signed, Lum LITTIE Louis, NP

## 2023-11-24 ENCOUNTER — Other Ambulatory Visit (HOSPITAL_COMMUNITY): Payer: Self-pay

## 2023-11-26 DIAGNOSIS — Z23 Encounter for immunization: Secondary | ICD-10-CM | POA: Diagnosis not present

## 2023-12-01 DIAGNOSIS — I10 Essential (primary) hypertension: Secondary | ICD-10-CM | POA: Diagnosis not present

## 2023-12-01 DIAGNOSIS — E1142 Type 2 diabetes mellitus with diabetic polyneuropathy: Secondary | ICD-10-CM | POA: Diagnosis not present

## 2023-12-01 DIAGNOSIS — E1169 Type 2 diabetes mellitus with other specified complication: Secondary | ICD-10-CM | POA: Diagnosis not present

## 2023-12-01 DIAGNOSIS — F4321 Adjustment disorder with depressed mood: Secondary | ICD-10-CM | POA: Diagnosis not present

## 2023-12-01 DIAGNOSIS — E785 Hyperlipidemia, unspecified: Secondary | ICD-10-CM | POA: Diagnosis not present

## 2023-12-04 DIAGNOSIS — Z23 Encounter for immunization: Secondary | ICD-10-CM | POA: Diagnosis not present

## 2023-12-04 DIAGNOSIS — I10 Essential (primary) hypertension: Secondary | ICD-10-CM | POA: Diagnosis not present

## 2023-12-04 DIAGNOSIS — I5032 Chronic diastolic (congestive) heart failure: Secondary | ICD-10-CM | POA: Diagnosis not present

## 2023-12-04 DIAGNOSIS — E1142 Type 2 diabetes mellitus with diabetic polyneuropathy: Secondary | ICD-10-CM | POA: Diagnosis not present

## 2023-12-04 DIAGNOSIS — Z950 Presence of cardiac pacemaker: Secondary | ICD-10-CM | POA: Diagnosis not present

## 2023-12-07 DIAGNOSIS — I10 Essential (primary) hypertension: Secondary | ICD-10-CM | POA: Diagnosis not present

## 2023-12-07 DIAGNOSIS — E1169 Type 2 diabetes mellitus with other specified complication: Secondary | ICD-10-CM | POA: Diagnosis not present

## 2023-12-07 DIAGNOSIS — E1142 Type 2 diabetes mellitus with diabetic polyneuropathy: Secondary | ICD-10-CM | POA: Diagnosis not present

## 2023-12-08 ENCOUNTER — Ambulatory Visit: Attending: Cardiovascular Disease

## 2023-12-08 DIAGNOSIS — I441 Atrioventricular block, second degree: Secondary | ICD-10-CM | POA: Diagnosis not present

## 2023-12-08 DIAGNOSIS — R001 Bradycardia, unspecified: Secondary | ICD-10-CM | POA: Diagnosis not present

## 2023-12-08 LAB — CUP PACEART INCLINIC DEVICE CHECK
Battery Remaining Longevity: 122 mo
Battery Voltage: 3.05 V
Brady Statistic RA Percent Paced: 49 %
Brady Statistic RV Percent Paced: 97 %
Date Time Interrogation Session: 20251007162620
Implantable Lead Connection Status: 753985
Implantable Lead Connection Status: 753985
Implantable Lead Implant Date: 20250919
Implantable Lead Implant Date: 20250919
Implantable Lead Location: 753859
Implantable Lead Location: 753860
Implantable Pulse Generator Implant Date: 20250919
Lead Channel Impedance Value: 412.5 Ohm
Lead Channel Impedance Value: 612.5 Ohm
Lead Channel Pacing Threshold Amplitude: 0.5 V
Lead Channel Pacing Threshold Amplitude: 0.5 V
Lead Channel Pacing Threshold Amplitude: 1 V
Lead Channel Pacing Threshold Amplitude: 1 V
Lead Channel Pacing Threshold Pulse Width: 0.5 ms
Lead Channel Pacing Threshold Pulse Width: 0.5 ms
Lead Channel Pacing Threshold Pulse Width: 0.5 ms
Lead Channel Pacing Threshold Pulse Width: 0.5 ms
Lead Channel Sensing Intrinsic Amplitude: 12 mV
Lead Channel Sensing Intrinsic Amplitude: 5 mV
Lead Channel Setting Pacing Amplitude: 1.125
Lead Channel Setting Pacing Amplitude: 1.5 V
Lead Channel Setting Pacing Pulse Width: 0.5 ms
Lead Channel Setting Sensing Sensitivity: 2 mV
Pulse Gen Model: 2272
Pulse Gen Serial Number: 8310011

## 2023-12-08 NOTE — Progress Notes (Signed)
 Normal dual chamber pacemaker wound check. Presenting rhythm: AS/VP 76 . Wound well healed. Stitch noted at distal end of incision.  Stitch removed.  Routine testing performed. Thresholds, sensing, and impedance consistent with implant measurements and at 3.5V safety margin/auto capture until 3 month visit. One AT episode-6 seconds. Reviewed arm restrictions to continue for 6 weeks total post op.  Pt enrolled in remote follow-up.

## 2023-12-08 NOTE — Patient Instructions (Signed)

## 2023-12-10 DIAGNOSIS — R2681 Unsteadiness on feet: Secondary | ICD-10-CM | POA: Diagnosis not present

## 2023-12-10 DIAGNOSIS — M62522 Muscle wasting and atrophy, not elsewhere classified, left upper arm: Secondary | ICD-10-CM | POA: Diagnosis not present

## 2023-12-10 DIAGNOSIS — M1990 Unspecified osteoarthritis, unspecified site: Secondary | ICD-10-CM | POA: Diagnosis not present

## 2023-12-10 DIAGNOSIS — M62521 Muscle wasting and atrophy, not elsewhere classified, right upper arm: Secondary | ICD-10-CM | POA: Diagnosis not present

## 2023-12-11 DIAGNOSIS — R2681 Unsteadiness on feet: Secondary | ICD-10-CM | POA: Diagnosis not present

## 2023-12-11 DIAGNOSIS — M62551 Muscle wasting and atrophy, not elsewhere classified, right thigh: Secondary | ICD-10-CM | POA: Diagnosis not present

## 2023-12-11 DIAGNOSIS — M62522 Muscle wasting and atrophy, not elsewhere classified, left upper arm: Secondary | ICD-10-CM | POA: Diagnosis not present

## 2023-12-11 DIAGNOSIS — R001 Bradycardia, unspecified: Secondary | ICD-10-CM | POA: Diagnosis not present

## 2023-12-11 DIAGNOSIS — M1990 Unspecified osteoarthritis, unspecified site: Secondary | ICD-10-CM | POA: Diagnosis not present

## 2023-12-11 DIAGNOSIS — Z950 Presence of cardiac pacemaker: Secondary | ICD-10-CM | POA: Diagnosis not present

## 2023-12-11 DIAGNOSIS — M62552 Muscle wasting and atrophy, not elsewhere classified, left thigh: Secondary | ICD-10-CM | POA: Diagnosis not present

## 2023-12-11 DIAGNOSIS — M62521 Muscle wasting and atrophy, not elsewhere classified, right upper arm: Secondary | ICD-10-CM | POA: Diagnosis not present

## 2023-12-14 DIAGNOSIS — L814 Other melanin hyperpigmentation: Secondary | ICD-10-CM | POA: Diagnosis not present

## 2023-12-14 DIAGNOSIS — D1801 Hemangioma of skin and subcutaneous tissue: Secondary | ICD-10-CM | POA: Diagnosis not present

## 2023-12-14 DIAGNOSIS — R2681 Unsteadiness on feet: Secondary | ICD-10-CM | POA: Diagnosis not present

## 2023-12-14 DIAGNOSIS — L821 Other seborrheic keratosis: Secondary | ICD-10-CM | POA: Diagnosis not present

## 2023-12-14 DIAGNOSIS — M1990 Unspecified osteoarthritis, unspecified site: Secondary | ICD-10-CM | POA: Diagnosis not present

## 2023-12-14 DIAGNOSIS — M62522 Muscle wasting and atrophy, not elsewhere classified, left upper arm: Secondary | ICD-10-CM | POA: Diagnosis not present

## 2023-12-14 DIAGNOSIS — L905 Scar conditions and fibrosis of skin: Secondary | ICD-10-CM | POA: Diagnosis not present

## 2023-12-14 DIAGNOSIS — M62521 Muscle wasting and atrophy, not elsewhere classified, right upper arm: Secondary | ICD-10-CM | POA: Diagnosis not present

## 2023-12-14 DIAGNOSIS — L570 Actinic keratosis: Secondary | ICD-10-CM | POA: Diagnosis not present

## 2023-12-14 DIAGNOSIS — L853 Xerosis cutis: Secondary | ICD-10-CM | POA: Diagnosis not present

## 2023-12-15 DIAGNOSIS — R001 Bradycardia, unspecified: Secondary | ICD-10-CM | POA: Diagnosis not present

## 2023-12-15 DIAGNOSIS — M62552 Muscle wasting and atrophy, not elsewhere classified, left thigh: Secondary | ICD-10-CM | POA: Diagnosis not present

## 2023-12-15 DIAGNOSIS — Z950 Presence of cardiac pacemaker: Secondary | ICD-10-CM | POA: Diagnosis not present

## 2023-12-15 DIAGNOSIS — M62551 Muscle wasting and atrophy, not elsewhere classified, right thigh: Secondary | ICD-10-CM | POA: Diagnosis not present

## 2023-12-15 DIAGNOSIS — M1990 Unspecified osteoarthritis, unspecified site: Secondary | ICD-10-CM | POA: Diagnosis not present

## 2023-12-15 DIAGNOSIS — M62521 Muscle wasting and atrophy, not elsewhere classified, right upper arm: Secondary | ICD-10-CM | POA: Diagnosis not present

## 2023-12-15 DIAGNOSIS — R2681 Unsteadiness on feet: Secondary | ICD-10-CM | POA: Diagnosis not present

## 2023-12-15 DIAGNOSIS — M62522 Muscle wasting and atrophy, not elsewhere classified, left upper arm: Secondary | ICD-10-CM | POA: Diagnosis not present

## 2023-12-16 DIAGNOSIS — R488 Other symbolic dysfunctions: Secondary | ICD-10-CM | POA: Diagnosis not present

## 2023-12-16 DIAGNOSIS — R4789 Other speech disturbances: Secondary | ICD-10-CM | POA: Diagnosis not present

## 2023-12-17 DIAGNOSIS — R2681 Unsteadiness on feet: Secondary | ICD-10-CM | POA: Diagnosis not present

## 2023-12-17 DIAGNOSIS — M62522 Muscle wasting and atrophy, not elsewhere classified, left upper arm: Secondary | ICD-10-CM | POA: Diagnosis not present

## 2023-12-17 DIAGNOSIS — M1990 Unspecified osteoarthritis, unspecified site: Secondary | ICD-10-CM | POA: Diagnosis not present

## 2023-12-17 DIAGNOSIS — M62552 Muscle wasting and atrophy, not elsewhere classified, left thigh: Secondary | ICD-10-CM | POA: Diagnosis not present

## 2023-12-17 DIAGNOSIS — M62551 Muscle wasting and atrophy, not elsewhere classified, right thigh: Secondary | ICD-10-CM | POA: Diagnosis not present

## 2023-12-17 DIAGNOSIS — M62521 Muscle wasting and atrophy, not elsewhere classified, right upper arm: Secondary | ICD-10-CM | POA: Diagnosis not present

## 2023-12-17 DIAGNOSIS — Z950 Presence of cardiac pacemaker: Secondary | ICD-10-CM | POA: Diagnosis not present

## 2023-12-17 DIAGNOSIS — R001 Bradycardia, unspecified: Secondary | ICD-10-CM | POA: Diagnosis not present

## 2023-12-21 DIAGNOSIS — M62521 Muscle wasting and atrophy, not elsewhere classified, right upper arm: Secondary | ICD-10-CM | POA: Diagnosis not present

## 2023-12-21 DIAGNOSIS — M62551 Muscle wasting and atrophy, not elsewhere classified, right thigh: Secondary | ICD-10-CM | POA: Diagnosis not present

## 2023-12-21 DIAGNOSIS — R001 Bradycardia, unspecified: Secondary | ICD-10-CM | POA: Diagnosis not present

## 2023-12-21 DIAGNOSIS — Z950 Presence of cardiac pacemaker: Secondary | ICD-10-CM | POA: Diagnosis not present

## 2023-12-21 DIAGNOSIS — M62552 Muscle wasting and atrophy, not elsewhere classified, left thigh: Secondary | ICD-10-CM | POA: Diagnosis not present

## 2023-12-21 DIAGNOSIS — M62522 Muscle wasting and atrophy, not elsewhere classified, left upper arm: Secondary | ICD-10-CM | POA: Diagnosis not present

## 2023-12-21 DIAGNOSIS — M1990 Unspecified osteoarthritis, unspecified site: Secondary | ICD-10-CM | POA: Diagnosis not present

## 2023-12-21 DIAGNOSIS — R2681 Unsteadiness on feet: Secondary | ICD-10-CM | POA: Diagnosis not present

## 2023-12-22 DIAGNOSIS — M1990 Unspecified osteoarthritis, unspecified site: Secondary | ICD-10-CM | POA: Diagnosis not present

## 2023-12-22 DIAGNOSIS — R2681 Unsteadiness on feet: Secondary | ICD-10-CM | POA: Diagnosis not present

## 2023-12-22 DIAGNOSIS — M62522 Muscle wasting and atrophy, not elsewhere classified, left upper arm: Secondary | ICD-10-CM | POA: Diagnosis not present

## 2023-12-22 DIAGNOSIS — M62521 Muscle wasting and atrophy, not elsewhere classified, right upper arm: Secondary | ICD-10-CM | POA: Diagnosis not present

## 2023-12-23 DIAGNOSIS — M62551 Muscle wasting and atrophy, not elsewhere classified, right thigh: Secondary | ICD-10-CM | POA: Diagnosis not present

## 2023-12-23 DIAGNOSIS — R2681 Unsteadiness on feet: Secondary | ICD-10-CM | POA: Diagnosis not present

## 2023-12-23 DIAGNOSIS — M62552 Muscle wasting and atrophy, not elsewhere classified, left thigh: Secondary | ICD-10-CM | POA: Diagnosis not present

## 2023-12-23 DIAGNOSIS — R001 Bradycardia, unspecified: Secondary | ICD-10-CM | POA: Diagnosis not present

## 2023-12-23 DIAGNOSIS — Z950 Presence of cardiac pacemaker: Secondary | ICD-10-CM | POA: Diagnosis not present

## 2023-12-25 DIAGNOSIS — R2681 Unsteadiness on feet: Secondary | ICD-10-CM | POA: Diagnosis not present

## 2023-12-25 DIAGNOSIS — M62521 Muscle wasting and atrophy, not elsewhere classified, right upper arm: Secondary | ICD-10-CM | POA: Diagnosis not present

## 2023-12-25 DIAGNOSIS — M62522 Muscle wasting and atrophy, not elsewhere classified, left upper arm: Secondary | ICD-10-CM | POA: Diagnosis not present

## 2023-12-25 DIAGNOSIS — M1990 Unspecified osteoarthritis, unspecified site: Secondary | ICD-10-CM | POA: Diagnosis not present

## 2023-12-27 DIAGNOSIS — M62521 Muscle wasting and atrophy, not elsewhere classified, right upper arm: Secondary | ICD-10-CM | POA: Diagnosis not present

## 2023-12-27 DIAGNOSIS — M62522 Muscle wasting and atrophy, not elsewhere classified, left upper arm: Secondary | ICD-10-CM | POA: Diagnosis not present

## 2023-12-27 DIAGNOSIS — M1990 Unspecified osteoarthritis, unspecified site: Secondary | ICD-10-CM | POA: Diagnosis not present

## 2023-12-27 DIAGNOSIS — R2681 Unsteadiness on feet: Secondary | ICD-10-CM | POA: Diagnosis not present

## 2023-12-28 DIAGNOSIS — M62522 Muscle wasting and atrophy, not elsewhere classified, left upper arm: Secondary | ICD-10-CM | POA: Diagnosis not present

## 2023-12-28 DIAGNOSIS — M62521 Muscle wasting and atrophy, not elsewhere classified, right upper arm: Secondary | ICD-10-CM | POA: Diagnosis not present

## 2023-12-28 DIAGNOSIS — R2681 Unsteadiness on feet: Secondary | ICD-10-CM | POA: Diagnosis not present

## 2023-12-28 DIAGNOSIS — M1990 Unspecified osteoarthritis, unspecified site: Secondary | ICD-10-CM | POA: Diagnosis not present

## 2023-12-30 DIAGNOSIS — R001 Bradycardia, unspecified: Secondary | ICD-10-CM | POA: Diagnosis not present

## 2023-12-30 DIAGNOSIS — Z950 Presence of cardiac pacemaker: Secondary | ICD-10-CM | POA: Diagnosis not present

## 2023-12-30 DIAGNOSIS — R2681 Unsteadiness on feet: Secondary | ICD-10-CM | POA: Diagnosis not present

## 2023-12-30 DIAGNOSIS — M62551 Muscle wasting and atrophy, not elsewhere classified, right thigh: Secondary | ICD-10-CM | POA: Diagnosis not present

## 2023-12-30 DIAGNOSIS — M62552 Muscle wasting and atrophy, not elsewhere classified, left thigh: Secondary | ICD-10-CM | POA: Diagnosis not present

## 2024-01-01 ENCOUNTER — Ambulatory Visit

## 2024-01-01 DIAGNOSIS — I10 Essential (primary) hypertension: Secondary | ICD-10-CM | POA: Diagnosis not present

## 2024-01-01 DIAGNOSIS — I441 Atrioventricular block, second degree: Secondary | ICD-10-CM

## 2024-01-01 DIAGNOSIS — Z950 Presence of cardiac pacemaker: Secondary | ICD-10-CM | POA: Diagnosis not present

## 2024-01-01 DIAGNOSIS — E1142 Type 2 diabetes mellitus with diabetic polyneuropathy: Secondary | ICD-10-CM | POA: Diagnosis not present

## 2024-01-01 DIAGNOSIS — M62551 Muscle wasting and atrophy, not elsewhere classified, right thigh: Secondary | ICD-10-CM | POA: Diagnosis not present

## 2024-01-01 DIAGNOSIS — F4321 Adjustment disorder with depressed mood: Secondary | ICD-10-CM | POA: Diagnosis not present

## 2024-01-01 DIAGNOSIS — M62552 Muscle wasting and atrophy, not elsewhere classified, left thigh: Secondary | ICD-10-CM | POA: Diagnosis not present

## 2024-01-01 DIAGNOSIS — E1169 Type 2 diabetes mellitus with other specified complication: Secondary | ICD-10-CM | POA: Diagnosis not present

## 2024-01-01 DIAGNOSIS — R2681 Unsteadiness on feet: Secondary | ICD-10-CM | POA: Diagnosis not present

## 2024-01-01 DIAGNOSIS — R001 Bradycardia, unspecified: Secondary | ICD-10-CM | POA: Diagnosis not present

## 2024-01-01 DIAGNOSIS — E785 Hyperlipidemia, unspecified: Secondary | ICD-10-CM | POA: Diagnosis not present

## 2024-01-02 LAB — CUP PACEART REMOTE DEVICE CHECK
Battery Remaining Longevity: 122 mo
Battery Remaining Percentage: 95.5 %
Battery Voltage: 3.04 V
Brady Statistic AP VP Percent: 54 %
Brady Statistic AP VS Percent: 4.7 %
Brady Statistic AS VP Percent: 35 %
Brady Statistic AS VS Percent: 5.5 %
Brady Statistic RA Percent Paced: 56 %
Brady Statistic RV Percent Paced: 89 %
Date Time Interrogation Session: 20251031020014
Implantable Lead Connection Status: 753985
Implantable Lead Connection Status: 753985
Implantable Lead Implant Date: 20250919
Implantable Lead Implant Date: 20250919
Implantable Lead Location: 753859
Implantable Lead Location: 753860
Implantable Pulse Generator Implant Date: 20250919
Lead Channel Impedance Value: 410 Ohm
Lead Channel Impedance Value: 600 Ohm
Lead Channel Pacing Threshold Amplitude: 0.5 V
Lead Channel Pacing Threshold Amplitude: 0.875 V
Lead Channel Pacing Threshold Pulse Width: 0.5 ms
Lead Channel Pacing Threshold Pulse Width: 0.5 ms
Lead Channel Sensing Intrinsic Amplitude: 12 mV
Lead Channel Sensing Intrinsic Amplitude: 5 mV
Lead Channel Setting Pacing Amplitude: 1.125
Lead Channel Setting Pacing Amplitude: 1.5 V
Lead Channel Setting Pacing Pulse Width: 0.5 ms
Lead Channel Setting Sensing Sensitivity: 2 mV
Pulse Gen Model: 2272
Pulse Gen Serial Number: 8310011

## 2024-01-05 DIAGNOSIS — M62551 Muscle wasting and atrophy, not elsewhere classified, right thigh: Secondary | ICD-10-CM | POA: Diagnosis not present

## 2024-01-05 DIAGNOSIS — Z950 Presence of cardiac pacemaker: Secondary | ICD-10-CM | POA: Diagnosis not present

## 2024-01-05 DIAGNOSIS — R001 Bradycardia, unspecified: Secondary | ICD-10-CM | POA: Diagnosis not present

## 2024-01-05 DIAGNOSIS — M62552 Muscle wasting and atrophy, not elsewhere classified, left thigh: Secondary | ICD-10-CM | POA: Diagnosis not present

## 2024-01-05 DIAGNOSIS — R2681 Unsteadiness on feet: Secondary | ICD-10-CM | POA: Diagnosis not present

## 2024-01-06 DIAGNOSIS — I10 Essential (primary) hypertension: Secondary | ICD-10-CM | POA: Diagnosis not present

## 2024-01-06 DIAGNOSIS — E1169 Type 2 diabetes mellitus with other specified complication: Secondary | ICD-10-CM | POA: Diagnosis not present

## 2024-01-06 DIAGNOSIS — E1142 Type 2 diabetes mellitus with diabetic polyneuropathy: Secondary | ICD-10-CM | POA: Diagnosis not present

## 2024-01-06 NOTE — Progress Notes (Signed)
 Remote PPM Transmission

## 2024-01-08 DIAGNOSIS — M62551 Muscle wasting and atrophy, not elsewhere classified, right thigh: Secondary | ICD-10-CM | POA: Diagnosis not present

## 2024-01-08 DIAGNOSIS — Z950 Presence of cardiac pacemaker: Secondary | ICD-10-CM | POA: Diagnosis not present

## 2024-01-08 DIAGNOSIS — R2681 Unsteadiness on feet: Secondary | ICD-10-CM | POA: Diagnosis not present

## 2024-01-08 DIAGNOSIS — R001 Bradycardia, unspecified: Secondary | ICD-10-CM | POA: Diagnosis not present

## 2024-01-08 DIAGNOSIS — M62552 Muscle wasting and atrophy, not elsewhere classified, left thigh: Secondary | ICD-10-CM | POA: Diagnosis not present

## 2024-01-11 DIAGNOSIS — L578 Other skin changes due to chronic exposure to nonionizing radiation: Secondary | ICD-10-CM | POA: Diagnosis not present

## 2024-01-14 DIAGNOSIS — I5032 Chronic diastolic (congestive) heart failure: Secondary | ICD-10-CM | POA: Diagnosis not present

## 2024-01-14 DIAGNOSIS — Z950 Presence of cardiac pacemaker: Secondary | ICD-10-CM | POA: Diagnosis not present

## 2024-01-14 DIAGNOSIS — E785 Hyperlipidemia, unspecified: Secondary | ICD-10-CM | POA: Diagnosis not present

## 2024-01-14 DIAGNOSIS — E1142 Type 2 diabetes mellitus with diabetic polyneuropathy: Secondary | ICD-10-CM | POA: Diagnosis not present

## 2024-01-14 DIAGNOSIS — I10 Essential (primary) hypertension: Secondary | ICD-10-CM | POA: Diagnosis not present

## 2024-01-31 DIAGNOSIS — E1169 Type 2 diabetes mellitus with other specified complication: Secondary | ICD-10-CM | POA: Diagnosis not present

## 2024-01-31 DIAGNOSIS — I10 Essential (primary) hypertension: Secondary | ICD-10-CM | POA: Diagnosis not present

## 2024-01-31 DIAGNOSIS — F4321 Adjustment disorder with depressed mood: Secondary | ICD-10-CM | POA: Diagnosis not present

## 2024-01-31 DIAGNOSIS — E785 Hyperlipidemia, unspecified: Secondary | ICD-10-CM | POA: Diagnosis not present

## 2024-01-31 DIAGNOSIS — E1142 Type 2 diabetes mellitus with diabetic polyneuropathy: Secondary | ICD-10-CM | POA: Diagnosis not present

## 2024-03-02 ENCOUNTER — Ambulatory Visit: Attending: Cardiology | Admitting: Cardiology

## 2024-03-02 ENCOUNTER — Encounter: Payer: Self-pay | Admitting: Cardiology

## 2024-03-02 VITALS — BP 130/64 | HR 72 | Ht 60.0 in | Wt 116.8 lb

## 2024-03-02 DIAGNOSIS — I10 Essential (primary) hypertension: Secondary | ICD-10-CM | POA: Diagnosis not present

## 2024-03-02 DIAGNOSIS — I447 Left bundle-branch block, unspecified: Secondary | ICD-10-CM | POA: Insufficient documentation

## 2024-03-02 DIAGNOSIS — R001 Bradycardia, unspecified: Secondary | ICD-10-CM | POA: Insufficient documentation

## 2024-03-02 DIAGNOSIS — I441 Atrioventricular block, second degree: Secondary | ICD-10-CM | POA: Diagnosis not present

## 2024-03-02 LAB — CUP PACEART INCLINIC DEVICE CHECK
Battery Remaining Longevity: 122 mo
Battery Voltage: 3.02 V
Brady Statistic RA Percent Paced: 49 %
Brady Statistic RV Percent Paced: 88 %
Date Time Interrogation Session: 20251231204459
Implantable Lead Connection Status: 753985
Implantable Lead Connection Status: 753985
Implantable Lead Implant Date: 20250919
Implantable Lead Implant Date: 20250919
Implantable Lead Location: 753859
Implantable Lead Location: 753860
Implantable Pulse Generator Implant Date: 20250919
Lead Channel Impedance Value: 450 Ohm
Lead Channel Impedance Value: 650 Ohm
Lead Channel Pacing Threshold Amplitude: 0.5 V
Lead Channel Pacing Threshold Amplitude: 0.875 V
Lead Channel Pacing Threshold Pulse Width: 0.5 ms
Lead Channel Pacing Threshold Pulse Width: 0.5 ms
Lead Channel Sensing Intrinsic Amplitude: 12 mV
Lead Channel Sensing Intrinsic Amplitude: 4.6 mV
Lead Channel Setting Pacing Amplitude: 1.125
Lead Channel Setting Pacing Amplitude: 1.5 V
Lead Channel Setting Pacing Pulse Width: 0.5 ms
Lead Channel Setting Sensing Sensitivity: 2 mV
Pulse Gen Model: 2272
Pulse Gen Serial Number: 8310011

## 2024-03-02 NOTE — Patient Instructions (Signed)
 Medication Instructions:  Your physician recommends that you continue on your current medications as directed. Please refer to the Current Medication list given to you today.  *If you need a refill on your cardiac medications before your next appointment, please call your pharmacy*  Lab Work: None ordered.  If you have labs (blood work) drawn today and your tests are completely normal, you will receive your results only by: MyChart Message (if you have MyChart) OR A paper copy in the mail If you have any lab test that is abnormal or we need to change your treatment, we will call you to review the results.  Testing/Procedures: None ordered.   Follow-Up: At [facility], you and your health needs are our priority.  As part of our continuing mission to provide you with exceptional heart care, our providers are all part of one team.  This team includes your primary Cardiologist (physician) and Advanced Practice Providers or APPs (Physician Assistants and Nurse Practitioners) who all work together to provide you with the care you need, when you need it.  Your next appointment:   12 month(s)  Provider:   You will see one of the following Advanced Practice Providers on your designated Care Team:   Sherry Oconnor, Sherry JERSEY Ozell Jodie Passey, Sherry Oconnor Sherry Riddle, Sherry Oconnor Sherry Barrack, Sherry Oconnor Sherry Pouch, Sherry Oconnor    We recommend signing up for the patient portal called MyChart.  Sign up information is provided on this After Visit Summary.  MyChart is used to connect with patients for Virtual Visits (Telemedicine).  Patients are able to view lab/test results, encounter notes, upcoming appointments, etc.  Non-urgent messages can be sent to your provider as well.   To learn more about what you can do with MyChart, go to [email or web address].

## 2024-03-02 NOTE — Progress Notes (Signed)
" °  Electrophysiology Office Note:   Date:  03/02/2024  ID:  Sherry Oconnor, DOB 16-Jan-1932, MRN [id number]  Primary Cardiologist: Vina Gull, MD Primary Heart Failure: None Electrophysiologist: None      History of Present Illness:   Sherry Oconnor is a 89 y.o. female with h/o diabetes, hypertension, second-degree AV block seen today for routine electrophysiology follow-up s/p Pacemaker implant.  Discussed the use of AI scribe software for clinical note transcription with the patient, who gave verbal consent to proceed.  History of Present Illness She is a 89 year old female with a pacemaker who presents for routine follow-up.  She notes that her blood pressure monitor often indicates an irregular heartbeat in the morning. Despite this, she feels well.  she denies chest pain, palpitations, dyspnea, PND, orthopnea, nausea, vomiting, dizziness, syncope, edema, weight gain, or early satiety.    Review of systems complete and found to be negative unless listed in HPI.      EP Information / Studies Reviewed:    EKG is ordered today. Personal review as below.  EKG Interpretation Date/Time:  Wednesday March 02 2024 14:32:07 EST Ventricular Rate:  72 PR Interval:  206 QRS Duration:  146 QT Interval:  418 QTC Calculation: 457 R Axis:   118  Text Interpretation: Sinus rhythm with VENTRICULAR PACING Premature atrial complexes Non-specific intra-ventricular conduction block Lateral infarct , age undetermined When compared with ECG of 21-Nov-2023 03:51, No significant change since last tracing Confirmed by [place], Antoine Vandermeulen ([id number]) on 03/02/2024 2:42:09 PM   PPM Interrogation-  reviewed in detail today,  See PACEART report.  Device History: Abbott Dual Chamber PPM implanted 11/20/2023 for Second Degree AV block  Risk Assessment/Calculations:           Physical Exam:   VS:  BP 130/64 (BP Location: Right Arm, Patient Position: Sitting, Cuff Size: Normal)   Pulse 72   Ht 5' (1.524 m)    Wt 116 lb 12.8 oz (53 kg)   SpO2 95%   BMI 22.81 kg/m    Wt Readings from Last 3 Encounters:  03/02/24 116 lb 12.8 oz (53 kg)  11/20/23 112 lb 9.6 oz (51.1 kg)  09/25/23 112 lb 6.4 oz (51 kg)     GEN: Well nourished, well developed in no acute distress NECK: No JVD; No carotid bruits CARDIAC: Regular rate and rhythm, no murmurs, rubs, gallops RESPIRATORY:  Clear to auscultation without rales, wheezing or rhonchi  ABDOMEN: Soft, non-tender, non-distended EXTREMITIES:  No edema; No deformity   ASSESSMENT AND PLAN:    Second Degree AV block s/p Abbott PPM  Normal PPM function See Pace Art report No changes today  2.  Hypertension:well controlled  Disposition:   Follow up with EP Team in 12 months  Signed, Gisell Buehrle Gladis Norton, MD  "

## 2024-04-01 ENCOUNTER — Ambulatory Visit

## 2024-04-01 DIAGNOSIS — I441 Atrioventricular block, second degree: Secondary | ICD-10-CM

## 2024-04-01 LAB — CUP PACEART REMOTE DEVICE CHECK
Battery Remaining Longevity: 120 mo
Battery Remaining Percentage: 95.5 %
Battery Voltage: 3.02 V
Brady Statistic AP VP Percent: 56 %
Brady Statistic AP VS Percent: 1.1 %
Brady Statistic AS VP Percent: 38 %
Brady Statistic AS VS Percent: 4.8 %
Brady Statistic RA Percent Paced: 55 %
Brady Statistic RV Percent Paced: 94 %
Date Time Interrogation Session: 20260130020016
Implantable Lead Connection Status: 753985
Implantable Lead Connection Status: 753985
Implantable Lead Implant Date: 20250919
Implantable Lead Implant Date: 20250919
Implantable Lead Location: 753859
Implantable Lead Location: 753860
Implantable Pulse Generator Implant Date: 20250919
Lead Channel Impedance Value: 440 Ohm
Lead Channel Impedance Value: 610 Ohm
Lead Channel Pacing Threshold Amplitude: 0.5 V
Lead Channel Pacing Threshold Amplitude: 0.75 V
Lead Channel Pacing Threshold Pulse Width: 0.5 ms
Lead Channel Pacing Threshold Pulse Width: 0.5 ms
Lead Channel Sensing Intrinsic Amplitude: 11.8 mV
Lead Channel Sensing Intrinsic Amplitude: 4.4 mV
Lead Channel Setting Pacing Amplitude: 1 V
Lead Channel Setting Pacing Amplitude: 1.5 V
Lead Channel Setting Pacing Pulse Width: 0.5 ms
Lead Channel Setting Sensing Sensitivity: 2 mV
Pulse Gen Model: 2272
Pulse Gen Serial Number: 8310011

## 2024-04-07 NOTE — Progress Notes (Signed)
 Remote PPM Transmission

## 2024-07-01 ENCOUNTER — Encounter

## 2024-09-30 ENCOUNTER — Encounter

## 2024-12-30 ENCOUNTER — Encounter

## 2025-03-31 ENCOUNTER — Encounter
